# Patient Record
Sex: Female | Born: 1955 | Race: White | Hispanic: No | Marital: Married | Smoking: Never smoker
Health system: Southern US, Community
[De-identification: ages and names within clinical notes are randomized; demographics above are authoritative.]

## PROBLEM LIST (undated history)

## (undated) DIAGNOSIS — Z8619 Personal history of other infectious and parasitic diseases: Secondary | ICD-10-CM

## (undated) DIAGNOSIS — F32A Depression, unspecified: Secondary | ICD-10-CM

## (undated) DIAGNOSIS — I471 Supraventricular tachycardia, unspecified: Secondary | ICD-10-CM

## (undated) DIAGNOSIS — F419 Anxiety disorder, unspecified: Secondary | ICD-10-CM

## (undated) DIAGNOSIS — M1990 Unspecified osteoarthritis, unspecified site: Secondary | ICD-10-CM

## (undated) DIAGNOSIS — N200 Calculus of kidney: Secondary | ICD-10-CM

## (undated) DIAGNOSIS — M810 Age-related osteoporosis without current pathological fracture: Secondary | ICD-10-CM

## (undated) DIAGNOSIS — M797 Fibromyalgia: Secondary | ICD-10-CM

## (undated) DIAGNOSIS — Z87442 Personal history of urinary calculi: Secondary | ICD-10-CM

## (undated) DIAGNOSIS — I10 Essential (primary) hypertension: Secondary | ICD-10-CM

## (undated) HISTORY — PX: DILATION AND CURETTAGE, DIAGNOSTIC / THERAPEUTIC: SUR384

## (undated) HISTORY — PX: LITHOTRIPSY: SUR834

## (undated) HISTORY — DX: Unspecified osteoarthritis, unspecified site: M19.90

## (undated) HISTORY — PX: CARDIAC ELECTROPHYSIOLOGY STUDY AND ABLATION: SHX1294

## (undated) HISTORY — DX: Depression, unspecified: F32.A

## (undated) HISTORY — PX: ABDOMINAL HYSTERECTOMY: SUR658

## (undated) HISTORY — DX: Personal history of other infectious and parasitic diseases: Z86.19

## (undated) HISTORY — DX: Supraventricular tachycardia, unspecified: I47.10

## (undated) HISTORY — DX: Calculus of kidney: N20.0

## (undated) HISTORY — DX: Supraventricular tachycardia: I47.1

## (undated) HISTORY — DX: Age-related osteoporosis without current pathological fracture: M81.0

## (undated) HISTORY — PX: KNEE CARTILAGE SURGERY: SHX688

---

## 2019-07-26 ENCOUNTER — Other Ambulatory Visit: Payer: Self-pay

## 2019-07-26 ENCOUNTER — Encounter: Payer: Self-pay | Admitting: Internal Medicine

## 2019-07-26 ENCOUNTER — Ambulatory Visit (INDEPENDENT_AMBULATORY_CARE_PROVIDER_SITE_OTHER): Payer: Medicare Other | Admitting: Internal Medicine

## 2019-07-26 VITALS — BP 116/74 | HR 70 | Temp 97.2°F | Ht 67.0 in | Wt 185.6 lb

## 2019-07-26 DIAGNOSIS — Z23 Encounter for immunization: Secondary | ICD-10-CM | POA: Diagnosis not present

## 2019-07-26 DIAGNOSIS — I471 Supraventricular tachycardia: Secondary | ICD-10-CM | POA: Diagnosis not present

## 2019-07-26 DIAGNOSIS — E663 Overweight: Secondary | ICD-10-CM

## 2019-07-26 DIAGNOSIS — Z13820 Encounter for screening for osteoporosis: Secondary | ICD-10-CM

## 2019-07-26 DIAGNOSIS — Z780 Asymptomatic menopausal state: Secondary | ICD-10-CM | POA: Diagnosis not present

## 2019-07-26 DIAGNOSIS — Z8619 Personal history of other infectious and parasitic diseases: Secondary | ICD-10-CM | POA: Insufficient documentation

## 2019-07-26 DIAGNOSIS — Z1211 Encounter for screening for malignant neoplasm of colon: Secondary | ICD-10-CM

## 2019-07-26 HISTORY — DX: Overweight: E66.3

## 2019-07-26 NOTE — Addendum Note (Signed)
Addended by: Kern Reap B on: 07/26/2019 05:30 PM   Modules accepted: Orders

## 2019-07-26 NOTE — Progress Notes (Signed)
New Patient Office Visit     This visit occurred during the SARS-CoV-2 public health emergency.  Safety protocols were in place, including screening questions prior to the visit, additional usage of staff PPE, and extensive cleaning of exam room while observing appropriate contact time as indicated for disinfecting solutions.    CC/Reason for Visit: Establish care, discuss chronic conditions Previous PCP: In [place] Last Visit: Approximately 18 months ago  HPI: Jodi Mcgrath is a 64 y.o. female who is coming in today for the above mentioned reasons. Past Medical History is significant for: History of supraventricular tachycardia status post ablation in 2009 and maintained on metoprolol 25 mg twice daily and has been well controlled.  She also suffered from Lyme disease in 2006 and states she has multiple joint pains related to Lyme disease episodes.  She moved from [place] 2 years ago to be closer to her daughter who has 5 children.  She is a never smoker, does not drink alcohol, does not have any known drug allergies.  Her past surgical history significant for lithotripsy in 2006 and a hysterectomy in 2011.  Her family history significant for a brother with coronary artery disease, another brother with coronary artery disease and diabetes, her father had non-Hodgkin's lymphoma and coronary artery disease.  She has no acute complaints today.  She is requesting Pneumovax today.  She had a normal mammogram earlier this year, and is due for colon and cervical cancer screening, she is also due for a bone density test.   Past Medical/Surgical History: Past Medical History:  Diagnosis Date  . H/o Lyme disease   . SVT (supraventricular tachycardia) (HCC)     Past Surgical History:  Procedure Laterality Date  . LITHOTRIPSY      Social History:  reports that she has never smoked. She has never used smokeless tobacco. She reports that she does not drink alcohol or use  drugs.  Allergies: Allergies  Allergen Reactions  . Mucinex [Guaifenesin Er] Rash    Family History:  Family History  Problem Relation Age of Onset  . CAD Father   . Non-Hodgkin's lymphoma Father   . CAD Brother   . Diabetes Brother   . Diabetes Brother      Current Outpatient Medications:  .  metoprolol tartrate (LOPRESSOR) 25 MG tablet, Take 25 mg by mouth 2 (two) times daily., Disp: , Rfl:   Review of Systems:  Constitutional: Denies fever, chills, diaphoresis, appetite change and fatigue.  HEENT: Denies photophobia, eye pain, redness, hearing loss, ear pain, congestion, sore throat, rhinorrhea, sneezing, mouth sores, trouble swallowing, neck pain, neck stiffness and tinnitus.   Respiratory: Denies SOB, DOE, cough, chest tightness,  and wheezing.   Cardiovascular: Denies chest pain, palpitations and leg swelling.  Gastrointestinal: Denies nausea, vomiting, abdominal pain, diarrhea, constipation, blood in stool and abdominal distention.  Genitourinary: Denies dysuria, urgency, frequency, hematuria, flank pain and difficulty urinating.  Endocrine: Denies: hot or cold intolerance, sweats, changes in hair or nails, polyuria, polydipsia. Musculoskeletal: Denies myalgias, back pain, joint swelling, arthralgias and gait problem.  Skin: Denies pallor, rash and wound.  Neurological: Denies dizziness, seizures, syncope, weakness, light-headedness, numbness and headaches.  Hematological: Denies adenopathy. Easy bruising, personal or family bleeding history  Psychiatric/Behavioral: Denies suicidal ideation, mood changes, confusion, nervousness, sleep disturbance and agitation    Physical Exam: Vitals:   07/26/19 0950  BP: 116/74  Pulse: 70  Temp: (!) 97.2 F (36.2 C)  TempSrc: Temporal  SpO2: 96%  Weight:  185 lb 9.6 oz (84.2 kg)  Height: 5\' 7"  (1.702 m)   Body mass index is 29.07 kg/m.  Constitutional: NAD, calm, comfortable Eyes: PERRL, lids and conjunctivae normal,  wears corrective lenses ENMT: Mucous membranes are moist. Respiratory: clear to auscultation bilaterally, no wheezing, no crackles. Normal respiratory effort. No accessory muscle use.  Cardiovascular: Regular rate and rhythm, no murmurs / rubs / gallops. No extremity edema.   Neurologic: Grossly intact and nonfocal Psychiatric: Normal judgment and insight. Alert and oriented x 3. Normal mood.    Impression and Plan:  Screening for osteoporosis -DEXA scan requested  SVT (supraventricular tachycardia) (HCC) -Well-controlled on beta-blocker.  Overweight (BMI 25.0-29.9) -Discussed healthy lifestyle, including increased physical activity and better food choices to promote weight loss.  Screening for malignant neoplasm of colon  - Plan: Ambulatory referral to Gastroenterology  H/o Lyme disease -Noted, unclear if multiple joint pain issues are related to this.  She will schedule follow-up for physical.  She will obtain shingles vaccination at her pharmacy.     Patient Instructions  -Nice seeing you today!!  -Pneumonia vaccine today.  -Shingles at your local pharmacy.  -Schedule 6 month follow up for your physical. Please come in fasting that day.     05-23-2001, MD [facility] at [facility]

## 2019-07-26 NOTE — Patient Instructions (Signed)
-  Nice seeing you today!!  -Pneumonia vaccine today.  -Shingles at your local pharmacy.  -Schedule 6 month follow up for your physical. Please come in fasting that day.

## 2019-07-28 ENCOUNTER — Telehealth: Payer: Self-pay | Admitting: Internal Medicine

## 2019-07-28 NOTE — Telephone Encounter (Signed)
Message Routed to PCP CMA 

## 2019-07-28 NOTE — Telephone Encounter (Signed)
Copied from CRM [id number]. Topic: General - Other >> Jul 28, 2019  9:41 AM Tamela Oddi wrote: Reason for CRM: Patient called to ask about the bone density test that the doctor stated she needed.  CB# [phone]

## 2019-07-29 NOTE — Telephone Encounter (Signed)
Patient would like to schedule a DEXA.  Scheduled.

## 2019-08-03 ENCOUNTER — Other Ambulatory Visit: Payer: Medicare Other

## 2019-08-09 ENCOUNTER — Ambulatory Visit (INDEPENDENT_AMBULATORY_CARE_PROVIDER_SITE_OTHER)
Admission: RE | Admit: 2019-08-09 | Discharge: 2019-08-09 | Disposition: A | Discharge status: Home / Self Care | Payer: Medicare Other | Source: Ambulatory Visit | Attending: Internal Medicine | Admitting: Internal Medicine

## 2019-08-09 ENCOUNTER — Other Ambulatory Visit: Payer: Self-pay

## 2019-08-09 DIAGNOSIS — Z780 Asymptomatic menopausal state: Secondary | ICD-10-CM | POA: Diagnosis not present

## 2019-08-10 ENCOUNTER — Other Ambulatory Visit: Payer: Self-pay

## 2019-08-10 ENCOUNTER — Ambulatory Visit (INDEPENDENT_AMBULATORY_CARE_PROVIDER_SITE_OTHER): Payer: Medicare Other | Admitting: Sports Medicine

## 2019-08-10 ENCOUNTER — Encounter: Payer: Self-pay | Admitting: Sports Medicine

## 2019-08-10 VITALS — BP 128/82 | Ht 67.0 in | Wt 180.0 lb

## 2019-08-10 DIAGNOSIS — M25561 Pain in right knee: Secondary | ICD-10-CM

## 2019-08-10 MED ORDER — METHYLPREDNISOLONE ACETATE 40 MG/ML IJ SUSP
40.0000 mg | Freq: Once | INTRAMUSCULAR | Status: AC
  Administered 2019-08-10: 40 mg via INTRA_ARTICULAR

## 2019-08-10 NOTE — Addendum Note (Signed)
Addended by: Rutha Bouchard E on: 08/10/2019 02:36 PM   Modules accepted: Orders

## 2019-08-10 NOTE — Patient Instructions (Signed)
Your knee pain is caused by arthritis of the right knee.  The ultrasound we did today showed mild arthritic changes in your knee. -To better evaluate the extent of the arthritis I have ordered x-rays.  You can get these done at your convenience and I will call you with results 1-2 business days after the x-rays get done -Steroid injection given to you at today's visit will take several days to start working.  You may ice your knee later tonight if it is sore -I have given you quad strengthening exercises.  Work on the program shown to you at today's visit to help increase your quad strength  I will see you back on an as-needed basis.  In 1 month if you are still having pain please come back to see me and we can discuss further steps in treating her pain.  If your pain completely resolves then no further follow-up is needed

## 2019-08-10 NOTE — Progress Notes (Addendum)
PCP: Isaac Bliss, Rayford Halsted, MD  Subjective:   HPI: Patient is a 64 y.o. female here for evaluation of right knee pain.  Patient notes her pain is been present for the last several weeks.  She denies any specific injury or trauma.  Pain is located above her knee and her quads as well as along the medial aspect of her knee.  She denies any mechanical symptoms such as locking or catching.  She denies any bruising or swelling.  Patient notes she is an avid walker and this has been significantly affecting her ability to be active.  She does note a remote history of right knee surgery back in the 70s.  She is unsure exactly what was done to it but believes it may have been an ACL repair or a meniscus repair.  Patient has not done anything for her knee pain yet.  She has not had any imaging of her knee.   Review of Systems: See HPI above.  Past Medical History:  Diagnosis Date  . H/o Lyme disease   . SVT (supraventricular tachycardia) (HCC)     Current Outpatient Medications on File Prior to Visit  Medication Sig Dispense Refill  . metoprolol tartrate (LOPRESSOR) 25 MG tablet Take 25 mg by mouth 2 (two) times daily.     No current facility-administered medications on file prior to visit.    Past Surgical History:  Procedure Laterality Date  . LITHOTRIPSY      Allergies  Allergen Reactions  . Mucinex [Guaifenesin Er] Rash    Social History   Socioeconomic History  . Marital status: Married    Spouse name: Not on file  . Number of children: Not on file  . Years of education: Not on file  . Highest education level: Not on file  Occupational History  . Not on file  Tobacco Use  . Smoking status: Never Smoker  . Smokeless tobacco: Never Used  Substance and Sexual Activity  . Alcohol use: Never  . Drug use: Never  . Sexual activity: Not on file  Other Topics Concern  . Not on file  Social History Narrative  . Not on file   Social Determinants of Health   Financial  Resource Strain:   . Difficulty of Paying Living Expenses: Not on file  Food Insecurity:   . Worried About Charity fundraiser in the Last Year: Not on file  . Ran Out of Food in the Last Year: Not on file  Transportation Needs:   . Lack of Transportation (Medical): Not on file  . Lack of Transportation (Non-Medical): Not on file  Physical Activity:   . Days of Exercise per Week: Not on file  . Minutes of Exercise per Session: Not on file  Stress:   . Feeling of Stress : Not on file  Social Connections:   . Frequency of Communication with Friends and Family: Not on file  . Frequency of Social Gatherings with Friends and Family: Not on file  . Attends Religious Services: Not on file  . Active Member of Clubs or Organizations: Not on file  . Attends Archivist Meetings: Not on file  . Marital Status: Not on file  Intimate Partner Violence:   . Fear of Current or Ex-Partner: Not on file  . Emotionally Abused: Not on file  . Physically Abused: Not on file  . Sexually Abused: Not on file    Family History  Problem Relation Age of Onset  . CAD  Father   . Non-Hodgkin's lymphoma Father   . CAD Brother   . Diabetes Brother   . Diabetes Brother         Objective:  Physical Exam: BP 128/82   Ht 5\' 7"  (1.702 m)   Wt 180 lb (81.6 kg)   BMI 28.19 kg/m  Gen: NAD, comfortable in exam room Lungs: Breathing comfortably on room air Knee Exam Right -Inspection: Longitudinal scar along the medial aspect of her knee from her prior knee surgery.  No other abnormality was seen -Palpation: Medial joint line tenderness -ROM: Extension: -0 degrees; Flexion: 130 degrees -Strength: Extension: 5/5; Flexion: 5/5 -Special Tests: Varus Stress: Negative; Valgus Stress: Negative; Lachman: Negative; Posterior drawer: Negative; McMurray: Negative; Thessaly: Negative -Limb neurovascularly intact, no instability noted  Contralateral Knee -Inspection: no deformity, no  discoloration -Palpation: medial joint line: Non-tender; lateral joint line: non-tender; quad tendon: non-tender; patella: non-tender; patellar tendon: non-tendon -ROM: Extension: 0 degrees; Flexion: 140 degrees -Strength: Extension: 5/5; Flexion: 5/5 -Limb neurovascularly intact, no instability noted  Limited diagnostic ultrasound of the right knee Findings: -Normal appearance of the quadricep tendon.  No tears were noted -Small amount of spurs noted at the patella at the quadriceps attachment -Normal appearance of the patellar tendon -Small joint effusion noted within the suprapatellar pouch -Fraying noted at the lateral meniscus.  There is also moderate amount of bone spurs along the lateral joint line. -Fraying at the medial meniscus.  Moderate amount of bone spurs along the medial joint line.  Is also fluid/edema noted surrounding the medial meniscus Impression: -Moderate amount of degenerative changes seen as well as possible fraying of the medial lateral meniscus   Assessment & Plan:  Patient is a 64 y.o. female here for evaluation of right knee pain  1.  Right knee pain -Ultrasound showing arthritic changes -X-rays were ordered to better evaluate the extent of the arthritis in her right knee -Consent was obtained for corticosteroid injection of right knee.  Timeout was performed prior to the procedure.  40 mg of Depo-Medrol 3 cc of lidocaine were injected using sterile technique.  Patient tolerated the injection well there are no complications -Patient was given a quad strengthening exercise program -Patient will follow up on an as-needed basis.  We will call her with the results of the x-rays  Addendum:  I was the preceptor for this visit and available for immediate consultation.  76 MD Norton Blizzard

## 2019-08-15 ENCOUNTER — Telehealth: Payer: Self-pay | Admitting: Internal Medicine

## 2019-08-15 NOTE — Telephone Encounter (Signed)
Patient needs to ask a question about Bone Density results.  She states the results didn't show up good.

## 2019-08-17 NOTE — Telephone Encounter (Signed)
Spoke with patient and a virtual visit was scheduled.   

## 2019-08-19 ENCOUNTER — Other Ambulatory Visit: Payer: Self-pay

## 2019-08-19 ENCOUNTER — Telehealth (INDEPENDENT_AMBULATORY_CARE_PROVIDER_SITE_OTHER): Payer: Medicare Other | Admitting: Internal Medicine

## 2019-08-19 DIAGNOSIS — M816 Localized osteoporosis [Lequesne]: Secondary | ICD-10-CM | POA: Diagnosis not present

## 2019-08-19 MED ORDER — ALENDRONATE SODIUM 70 MG PO TABS: 70.0000 mg | ORAL_TABLET | ORAL | 3 refills | Status: DC

## 2019-08-19 NOTE — Progress Notes (Signed)
Virtual Visit via Video Note  I connected with Jodi Mcgrath on 08/19/19 at  1:30 PM EST by a video enabled telemedicine application and verified that I am speaking with the correct person using two identifiers.  Location patient: home Location provider: work office Persons participating in the virtual visit: patient, provider  I discussed the limitations of evaluation and management by telemedicine and the availability of in person appointments. The patient expressed understanding and agreed to proceed.   HPI: This visit has been scheduled to follow-up on her DEXA scan.  Her DEXA was done on January 19 and showed a lumbar spine T score of -2.5, right femoral neck was -1.9 and left femoral neck was -1.4.  She feels well and has no issues.  She is wondering whether she can continue her active lifestyle despite these results.   ROS: Constitutional: Denies fever, chills, diaphoresis, appetite change and fatigue.  HEENT: Denies photophobia, eye pain, redness, hearing loss, ear pain, congestion, sore throat, rhinorrhea, sneezing, mouth sores, trouble swallowing, neck pain, neck stiffness and tinnitus.   Respiratory: Denies SOB, DOE, cough, chest tightness,  and wheezing.   Cardiovascular: Denies chest pain, palpitations and leg swelling.  Gastrointestinal: Denies nausea, vomiting, abdominal pain, diarrhea, constipation, blood in stool and abdominal distention.  Genitourinary: Denies dysuria, urgency, frequency, hematuria, flank pain and difficulty urinating.  Endocrine: Denies: hot or cold intolerance, sweats, changes in hair or nails, polyuria, polydipsia. Musculoskeletal: Denies myalgias, back pain, joint swelling, arthralgias and gait problem.  Skin: Denies pallor, rash and wound.  Neurological: Denies dizziness, seizures, syncope, weakness, light-headedness, numbness and headaches.  Hematological: Denies adenopathy. Easy bruising, personal or family bleeding history    Psychiatric/Behavioral: Denies suicidal ideation, mood changes, confusion, nervousness, sleep disturbance and agitation   Past Medical History:  Diagnosis Date  . H/o Lyme disease   . SVT (supraventricular tachycardia) (HCC)     Past Surgical History:  Procedure Laterality Date  . LITHOTRIPSY      Family History  Problem Relation Age of Onset  . CAD Father   . Non-Hodgkin's lymphoma Father   . CAD Brother   . Diabetes Brother   . Diabetes Brother     SOCIAL HX:   reports that she has never smoked. She has never used smokeless tobacco. She reports that she does not drink alcohol or use drugs.   Current Outpatient Medications:  .  alendronate (FOSAMAX) 70 MG tablet, Take 1 tablet (70 mg total) by mouth every 7 (seven) days. Take with a full glass of water on an empty stomach., Disp: 4 tablet, Rfl: 3 .  metoprolol tartrate (LOPRESSOR) 25 MG tablet, Take 25 mg by mouth 2 (two) times daily., Disp: , Rfl:   EXAM:   VITALS per patient if applicable: None reported  GENERAL: alert, oriented, appears well and in no acute distress  HEENT: atraumatic, conjunttiva clear, no obvious abnormalities on inspection of external nose and ears, wears corrective lenses  NECK: normal movements of the head and neck  LUNGS: on inspection no signs of respiratory distress, breathing rate appears normal, no obvious gross increased work of breathing, gasping or wheezing  CV: no obvious cyanosis  MS: moves all visible extremities without noticeable abnormality  PSYCH/NEURO: pleasant and cooperative, no obvious depression or anxiety, speech and thought processing grossly intact  ASSESSMENT AND PLAN:   Localized osteoporosis without current pathological fracture  -After discussing pros and cons, we have decided to start her on Fosamax 70 mg weekly. -She  will secure dental care. -Okay to continue active lifestyle as she is. -She will also maximize calcium and vitamin D supplementation.     I discussed the assessment and treatment plan with the patient. The patient was provided an opportunity to ask questions and all were answered. The patient agreed with the plan and demonstrated an understanding of the instructions.   The patient was advised to call back or seek an in-person evaluation if the symptoms worsen or if the condition fails to improve as anticipated.    Chaya Jan, MD  [facility] at [facility]

## 2019-08-23 ENCOUNTER — Ambulatory Visit: Payer: Medicare Other | Admitting: Sports Medicine

## 2019-08-25 ENCOUNTER — Ambulatory Visit
Admission: RE | Admit: 2019-08-25 | Discharge: 2019-08-25 | Disposition: A | Discharge status: Home / Self Care | Payer: Medicare Other | Source: Ambulatory Visit | Attending: Family Medicine | Admitting: Family Medicine

## 2019-08-25 DIAGNOSIS — M25561 Pain in right knee: Secondary | ICD-10-CM

## 2019-08-26 ENCOUNTER — Encounter: Payer: Self-pay | Admitting: Sports Medicine

## 2019-08-26 ENCOUNTER — Telehealth (INDEPENDENT_AMBULATORY_CARE_PROVIDER_SITE_OTHER): Payer: Medicare Other | Admitting: Sports Medicine

## 2019-08-26 VITALS — Ht 67.0 in | Wt 180.0 lb

## 2019-08-26 DIAGNOSIS — M1711 Unilateral primary osteoarthritis, right knee: Secondary | ICD-10-CM

## 2019-08-26 MED ORDER — MELOXICAM 15 MG PO TABS: 15.0000 mg | ORAL_TABLET | Freq: Every day | ORAL | 0 refills | Status: DC

## 2019-08-26 NOTE — Progress Notes (Addendum)
PCP: Philip Aspen, Limmie Patricia, MD  Subjective:   HPI: Patient is a 64 y.o. female here for virtual follow-up of her right knee pain.  Patient was last seen on January 20.  She had x-rays done since last visit confirming mild to moderate tricompartmental osteoarthritis of the right knee.  Patient had a corticosteroid injection done at her last visit.  Patient notes ongoing knee pain.  She is gotten slightly worse swelling since she was last seen.  Patient is still trying to walk about 3 miles a day but notes this is become difficult for her.  She not get much benefit from her corticosteroid injection.   Review of Systems: See HPI above.  Past Medical History:  Diagnosis Date  . H/o Lyme disease   . SVT (supraventricular tachycardia) (HCC)     Current Outpatient Medications on File Prior to Visit  Medication Sig Dispense Refill  . metoprolol tartrate (LOPRESSOR) 25 MG tablet Take 25 mg by mouth 2 (two) times daily.    Marland Kitchen alendronate (FOSAMAX) 70 MG tablet Take 1 tablet (70 mg total) by mouth every 7 (seven) days. Take with a full glass of water on an empty stomach. 4 tablet 3   No current facility-administered medications on file prior to visit.    Past Surgical History:  Procedure Laterality Date  . LITHOTRIPSY      Allergies  Allergen Reactions  . Mucinex [Guaifenesin Er] Rash    Social History   Socioeconomic History  . Marital status: Married    Spouse name: Not on file  . Number of children: Not on file  . Years of education: Not on file  . Highest education level: Not on file  Occupational History  . Not on file  Tobacco Use  . Smoking status: Never Smoker  . Smokeless tobacco: Never Used  Substance and Sexual Activity  . Alcohol use: Never  . Drug use: Never  . Sexual activity: Not on file  Other Topics Concern  . Not on file  Social History Narrative  . Not on file   Social Determinants of Health   Financial Resource Strain:   . Difficulty of Paying  Living Expenses: Not on file  Food Insecurity:   . Worried About Programme researcher, broadcasting/film/video in the Last Year: Not on file  . Ran Out of Food in the Last Year: Not on file  Transportation Needs:   . Lack of Transportation (Medical): Not on file  . Lack of Transportation (Non-Medical): Not on file  Physical Activity:   . Days of Exercise per Week: Not on file  . Minutes of Exercise per Session: Not on file  Stress:   . Feeling of Stress : Not on file  Social Connections:   . Frequency of Communication with Friends and Family: Not on file  . Frequency of Social Gatherings with Friends and Family: Not on file  . Attends Religious Services: Not on file  . Active Member of Clubs or Organizations: Not on file  . Attends Banker Meetings: Not on file  . Marital Status: Not on file  Intimate Partner Violence:   . Fear of Current or Ex-Partner: Not on file  . Emotionally Abused: Not on file  . Physically Abused: Not on file  . Sexually Abused: Not on file    Family History  Problem Relation Age of Onset  . CAD Father   . Non-Hodgkin's lymphoma Father   . CAD Brother   . Diabetes Brother   .  Diabetes Brother         Objective:  Physical Exam: Ht 5\' 7"  (1.702 m)   Wt 180 lb (81.6 kg)   BMI 28.19 kg/m  Gen: NAD, comfortable in exam room Lungs: Breathing comfortably on room air   Assessment & Plan:  Patient is a 64 y.o. female here for virtual follow-up of right knee pain next  1.  Right knee osteoarthritis -Patient did not get significant benefit from her corticosteroid injection done on August 10, 2019 -Prescription was sent for meloxicam.  Patient is advised to take this once a day and not take it with other NSAIDs.  She may take Tylenol with this -Patient has a brace at home that she will start wearing with activity -Patient will follow up with me in the office on Wednesday.  At that visit we will discuss possibly getting the MRI to evaluate for concurrent meniscus  tear as her arthritis is mild versus discussion of possible viscosupplementation  I was the preceptor for this visit and available for immediate consultation Shellia Cleverly, DO

## 2019-08-29 ENCOUNTER — Telehealth: Payer: Self-pay

## 2019-08-29 NOTE — Telephone Encounter (Signed)
I told the pt we can cancel her f/u appt and if her pain worsens she can call us to come in. The plan going forward would be possible MRI vs viscosupplementation injections as Dr. Lyn Hollingshead discussed with her at her last visit. Pt understands.

## 2019-11-01 ENCOUNTER — Encounter: Payer: Self-pay | Admitting: Internal Medicine

## 2019-12-02 ENCOUNTER — Telehealth (INDEPENDENT_AMBULATORY_CARE_PROVIDER_SITE_OTHER): Payer: Medicare Other | Admitting: Internal Medicine

## 2019-12-02 DIAGNOSIS — R42 Dizziness and giddiness: Secondary | ICD-10-CM | POA: Diagnosis not present

## 2019-12-02 DIAGNOSIS — H9312 Tinnitus, left ear: Secondary | ICD-10-CM | POA: Diagnosis not present

## 2019-12-02 MED ORDER — MECLIZINE HCL 25 MG PO TABS: 25.0000 mg | ORAL_TABLET | Freq: Three times a day (TID) | ORAL | 0 refills | Status: DC | PRN

## 2019-12-02 NOTE — Progress Notes (Signed)
Virtual Visit via Video Note  I connected with Jodi Mcgrath on 12/02/19 at  3:30 PM EDT by a video enabled telemedicine application and verified that I am speaking with the correct person using two identifiers.  Location patient: home Location provider: work office Persons participating in the virtual visit: patient, provider  I discussed the limitations of evaluation and management by telemedicine and the availability of in person appointments. The patient expressed understanding and agreed to proceed.   HPI: She has had issues with left ear vertigo and tinnitus for years. Has had MRIs in the past and has seen ENT without issues. Tinnitus and vertigo has gotten worse in the past week. Hearing has decreased as well.   ROS: Constitutional: Denies fever, chills, diaphoresis, appetite change and fatigue.  HEENT: Denies photophobia, eye pain, redness, hearing loss, ear pain, congestion, sore throat, rhinorrhea, sneezing, mouth sores, trouble swallowing, neck pain, neck stiffness and tinnitus.   Respiratory: Denies SOB, DOE, cough, chest tightness,  and wheezing.   Cardiovascular: Denies chest pain, palpitations and leg swelling.  Gastrointestinal: Denies nausea, vomiting, abdominal pain, diarrhea, constipation, blood in stool and abdominal distention.  Genitourinary: Denies dysuria, urgency, frequency, hematuria, flank pain and difficulty urinating.  Endocrine: Denies: hot or cold intolerance, sweats, changes in hair or nails, polyuria, polydipsia. Musculoskeletal: Denies myalgias, back pain, joint swelling, arthralgias and gait problem.  Skin: Denies pallor, rash and wound.  Neurological: Denies dizziness, seizures, syncope, weakness, light-headedness, numbness and headaches.  Hematological: Denies adenopathy. Easy bruising, personal or family bleeding history  Psychiatric/Behavioral: Denies suicidal ideation, mood changes, confusion, nervousness, sleep disturbance and  agitation   Past Medical History:  Diagnosis Date  . H/o Lyme disease   . SVT (supraventricular tachycardia) (HCC)     Past Surgical History:  Procedure Laterality Date  . LITHOTRIPSY      Family History  Problem Relation Age of Onset  . CAD Father   . Non-Hodgkin's lymphoma Father   . CAD Brother   . Diabetes Brother   . Diabetes Brother     SOCIAL HX:   reports that she has never smoked. She has never used smokeless tobacco. She reports that she does not drink alcohol or use drugs.   Current Outpatient Medications:  .  alendronate (FOSAMAX) 70 MG tablet, Take 1 tablet (70 mg total) by mouth every 7 (seven) days. Take with a full glass of water on an empty stomach., Disp: 4 tablet, Rfl: 3 .  meclizine (ANTIVERT) 25 MG tablet, Take 1 tablet (25 mg total) by mouth 3 (three) times daily as needed for dizziness., Disp: 30 tablet, Rfl: 0 .  meloxicam (MOBIC) 15 MG tablet, Take 1 tablet (15 mg total) by mouth daily., Disp: 30 tablet, Rfl: 0 .  metoprolol tartrate (LOPRESSOR) 25 MG tablet, Take 25 mg by mouth 2 (two) times daily., Disp: , Rfl:   EXAM:   VITALS per patient if applicable:   GENERAL: alert, oriented, appears well and in no acute distress  HEENT: atraumatic, conjunttiva clear, no obvious abnormalities on inspection of external nose and ears, wears corrective lenses  NECK: normal movements of the head and neck  LUNGS: on inspection no signs of respiratory distress, breathing rate appears normal, no obvious gross increased work of breathing, gasping or wheezing  CV: no obvious cyanosis  MS: moves all visible extremities without noticeable abnormality  PSYCH/NEURO: pleasant and cooperative, no obvious depression or anxiety, speech and thought processing grossly intact  ASSESSMENT AND PLAN:  Tinnitus of left ear   Vertigo  -Will give some meclizine, advised B complex vitamins. -Have offered referral back to ENT but she would like to hold on that for  now. -RTC if no improvement in 10-14 days.     I discussed the assessment and treatment plan with the patient. The patient was provided an opportunity to ask questions and all were answered. The patient agreed with the plan and demonstrated an understanding of the instructions.   The patient was advised to call back or seek an in-person evaluation if the symptoms worsen or if the condition fails to improve as anticipated.    Chaya Jan, MD  [facility]

## 2019-12-12 ENCOUNTER — Other Ambulatory Visit: Payer: Self-pay

## 2019-12-13 ENCOUNTER — Ambulatory Visit: Payer: Medicare Other | Admitting: Internal Medicine

## 2020-01-26 ENCOUNTER — Telehealth: Payer: Self-pay | Admitting: Internal Medicine

## 2020-01-26 MED ORDER — METOPROLOL TARTRATE 25 MG PO TABS: 25.0000 mg | ORAL_TABLET | Freq: Two times a day (BID) | ORAL | 1 refills | Status: DC

## 2020-01-26 NOTE — Telephone Encounter (Signed)
Refill sent.

## 2020-01-26 NOTE — Telephone Encounter (Signed)
Pt is calling in stating that she is almost out (5 pills on hand) of metoprolol tartrate (LOPRESSOR) 25 MG Pharm:  [facility] in [facility]

## 2020-03-23 ENCOUNTER — Telehealth: Payer: Self-pay | Admitting: Internal Medicine

## 2020-03-23 NOTE — Telephone Encounter (Signed)
Nurse Triage advise that the patient go to the ED

## 2020-03-23 NOTE — Telephone Encounter (Signed)
Kimberly,PT is calling in stating that the pt is at the center for PT and she has High BP and would like to see what she should do.  Pt told the physical therapist that she has always been bothered with high blood pressure but she is not thinking it had been like this before. They were transferred to the Triage Nurse.

## 2020-03-23 NOTE — Telephone Encounter (Signed)
Patient called back and was complaining of vertigo and a migraine.  She refused to go to the ED.  I advised the patient to go to UC.

## 2020-05-29 IMAGING — DX DG KNEE AP/LAT W/ SUNRISE*R*
3 series · 3 of 3 positions shown · non-contrast
Comparison: None.

CLINICAL DATA: Chronic right knee pain

EXAM:
RIGHT KNEE 3 VIEWS

[dg knee ap/lat w/ sunrise right (1 of 3)]
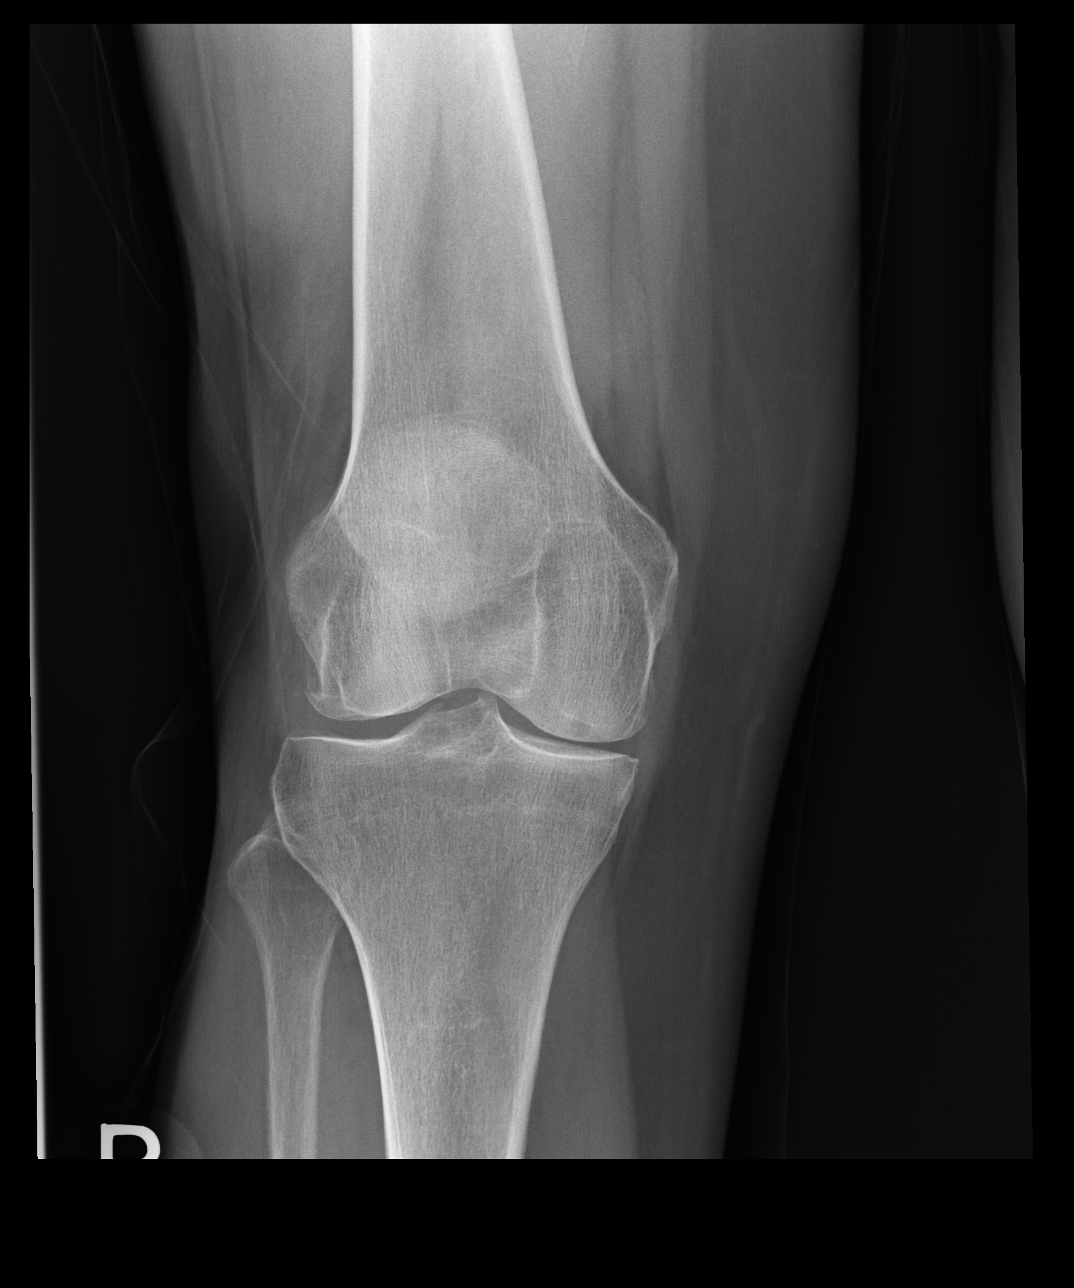

[dg knee ap/lat w/ sunrise right (2 of 3)]
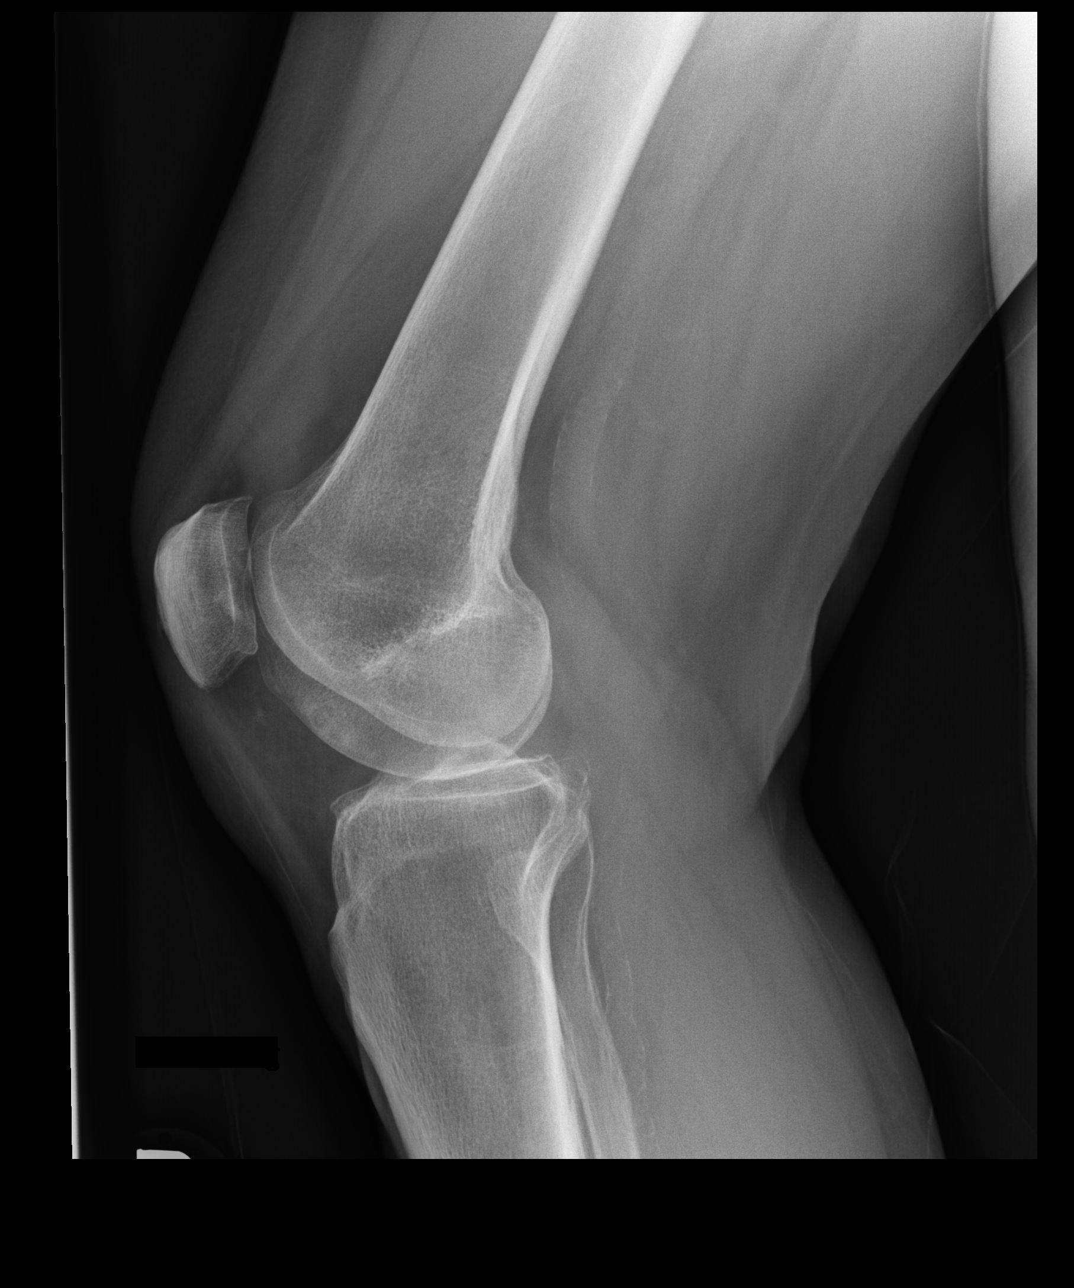

[dg knee ap/lat w/ sunrise right (3 of 3)]
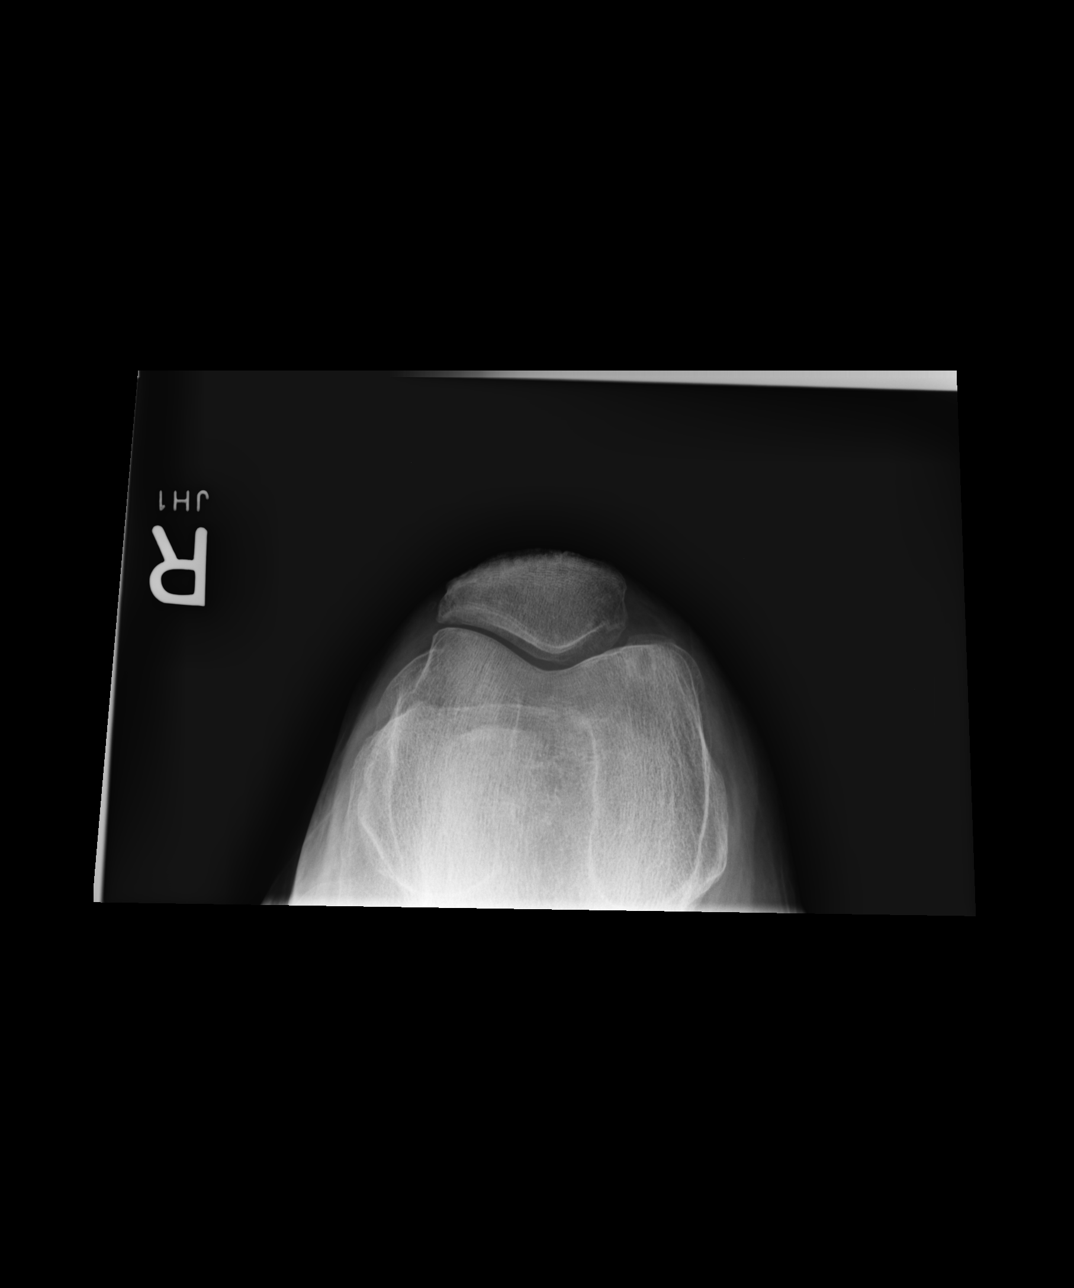

[3 of 3 positions shown; findings below may reference images not displayed]

FINDINGS: There is moderate medial compartmental joint space narrowing. Mild
narrowing is seen in the lateral compartment. Small spurs are seen
along the upper and lower poles of the patella. No fracture,
subluxation, or dislocation. Small joint effusion is likely
reactive.
IMPRESSION: 1. Mild to moderate 3 compartmental osteoarthritis greatest in the
medial compartment.
2. Small reactive right knee effusion.

## 2020-06-27 ENCOUNTER — Ambulatory Visit (INDEPENDENT_AMBULATORY_CARE_PROVIDER_SITE_OTHER): Payer: Medicare Other | Admitting: Family Medicine

## 2020-06-27 ENCOUNTER — Encounter: Payer: Self-pay | Admitting: Family Medicine

## 2020-06-27 ENCOUNTER — Other Ambulatory Visit: Payer: Self-pay

## 2020-06-27 DIAGNOSIS — M1711 Unilateral primary osteoarthritis, right knee: Secondary | ICD-10-CM | POA: Insufficient documentation

## 2020-06-27 HISTORY — DX: Unilateral primary osteoarthritis, right knee: M17.11

## 2020-06-27 MED ORDER — METHYLPREDNISOLONE ACETATE 80 MG/ML IJ SUSP
80.0000 mg | Freq: Once | INTRAMUSCULAR | Status: AC
  Administered 2020-06-27: 80 mg via INTRA_ARTICULAR

## 2020-06-27 NOTE — Progress Notes (Addendum)
PCP: Philip Aspen, Limmie Patricia, MD  Subjective:   HPI: Patient is a 64 y.o. female here for right knee pain.  Patient reports that this episode of her right knee pain has been going on for 3 weeks.  She has tried ibuprofen as well as Voltaren gel with little relief.  Pain is mainly with ambulation.  Tenderness to palpation along the anterior medial aspect of the knee.  Reports that it occasionally becomes erythematous  X-rays performed in January of this year showed mild to moderate tricompartmental osteoarthritis of the right knee.  Patient reports that she has not had a knee injection in "40 years".  She did have an injury to her right knee when she was 16 and she had surgery but is unsure of the surgery.  Past Medical History:  Diagnosis Date  . H/o Lyme disease   . SVT (supraventricular tachycardia) (HCC)     Family History  Problem Relation Age of Onset  . CAD Father   . Non-Hodgkin's lymphoma Father   . CAD Brother   . Diabetes Brother   . Diabetes Brother     BP (!) 150/72   Ht 5\' 7"  (1.702 m)   Wt 176 lb (79.8 kg)   BMI 27.57 kg/m   [facility] 06/27/2020  Frequency of aerobic exercise (# of days/week) 0  Average time in minutes 0  Frequency of strengthening activities (# of days/week) 0    No flowsheet data found.  Review of Systems: See HPI above.     Objective:  Physical Exam:  Gen: NAD, comfortable in exam room  Right knee exam No gross deformity, ecchymoses.  Mild amount of joint effusion noted Mild tenderness to palpation along medial and lateral joint lines.  Patient also has pain with valgus pressure. Mild decreased range of motion in extension.  Normal range of motion with flexion Negative ant/post drawers NV intact distally.  INJECTION: Informed written consent was obtained, timeout was performed. Area prepped in usual fashion with overlying skin cleaned using iodine and isopropyl alcohol. Local anesthesia achieved  using Ethyl Chloride spray (Cooling spray). 2 cc of methylprednisolone 40 mg/ml plus  4  cc of 1% lidocaine without epinephrine was injected into the right knee joint using a(n) anterior lateral approach. The patient tolerated the procedure well. There were no complications. Post procedure instructions were given.    Assessment & Plan:  Right knee pain Patient with 3-week knee pain.  Known osteoarthritis of right knee.  Findings consistent with osteoarthritic pain.  Mild joint effusion noted.  Patient elected for right knee injection today and this procedure was tolerated well.  We will follow-up as needed  14/02/2020, MD  PGY-2, [facility]  06/27/2109:55 AM   Resident Attestation   I saw and evaluated the patient, performing the key elements of the service.I personally performed or re-performed the history, physical exam, and medical decision making activities of this service and have verified that the service and findings are accurately documented in the resident's note. I developed the management plan that is described in the resident's note, and I agree with the content, with my edits above.   14/02/2109, DO [facility], PGY-4  Addendum:  Patient seen in the office by fellow and resident.  Their history, exam, plan of care were precepted with me.  Jules Schick MD [facility]

## 2020-06-27 NOTE — Patient Instructions (Signed)
It was great to meet you today! Thank you for letting me participate in your care!  Today, we discussed your right knee arthritis pain which is due to an acute flare. The steroid injection should give you great relief for at least 3 months.   Please do the home exercises 4-5 times per week and use a stationary bike along with water exercises/swimming to help strengthen the muscles around the knee to lessen pressure on the knee joint.  Please come back and see me if you do not get improvement.  Be well, Jules Schick, DO PGY-4, Sports Medicine Fellow [facility]

## 2020-08-01 ENCOUNTER — Other Ambulatory Visit: Payer: Self-pay

## 2020-08-01 ENCOUNTER — Ambulatory Visit (INDEPENDENT_AMBULATORY_CARE_PROVIDER_SITE_OTHER): Payer: Medicare Other | Admitting: Family Medicine

## 2020-08-01 VITALS — BP 126/70 | Ht 67.0 in | Wt 173.0 lb

## 2020-08-01 DIAGNOSIS — M1711 Unilateral primary osteoarthritis, right knee: Secondary | ICD-10-CM

## 2020-08-01 NOTE — Progress Notes (Addendum)
    SUBJECTIVE:   CHIEF COMPLAINT / HPI:   Jodi Mcgrath is a 65 y.o. female here for follow up of right knee pain.   Knee pain Pt reports resolution of posterior right knee pain but anteromedial knee pain persists. She takes Tylenol arthritis once a day with some relief. She occasionally takes Advil which helps more than Tylenol. She has been using her stationary bike which relieves her stiffness. She reports minimal relief with previous steroid injection.    States she use to be very active but is limited in activity due to her knee pain. Wants to be able to garden and interact with her five grandkids like before.   Unfortunately since her last visit the corticosteroid injection provided very minimal relief for only a few days.   PERTINENT  PMH / PSH: reviewed and updated as appropriate   OBJECTIVE:   Blood pressure 126/70, height 5\' 7"  (1.702 m), weight 173 lb (78.5 kg). Body mass index is 27.1 kg/m.   GEN: well appearing female in no acute distress  CVS: well perfused, distal pulses intact  RESP: speaking in full sentences without pause, no respiratory distress  MSK right knee:  -Inspection: no deformity, no discoloration, inferolateral effusion, positive for mild swelling -Palpation: no joint line tenderness -ROM: Extension:  Active ROM limited due to pain, normal passive ROM, Flexion: normal active and passive ROM   -strength 5/5  -Special Tests: Varus Stress: Negative; Valgus Stress: Negative; Posterior drawer: Negative; McMurray: Negative -Limb neurovascularly intact, no instability noted    ASSESSMENT/PLAN:   Patient is a 65 year old female with history of osteoarthritis who presented for ongoing right knee pain.  Osteoarthritic knee pain Patient without relief from previous knee injection.  Discussed several treatment options (knee sleeve, gel shots, surgery, NSAIDs and Tylenol).  Patient does not want to have surgery at this time and would like to continue  conservative therapy.  Recommended gel shots and patient is agreeable.  We will seek insurance approval.  Knee compression sleeve provided.  Start Voltaren gel (OTC) as needed. Tylenol arthritis 3 times daily for 7 days.  Follow up for gel shots.     69, DO PGY-2, [facility] 08/01/20     Resident Attestation   I saw and evaluated the patient, performing the key elements of the service. I personally performed or re-performed the history, physical exam, and medical decision making activities of this service and have verified that the service and findings are accurately documented in the resident's note. I developed the management plan that is described in the resident's note, and I agree with the content, with my edits above.  09/29/20, DO [facility], PGY-4  I was a preceptor for this visit and available for immediate consultation Jules Schick, DO

## 2020-08-01 NOTE — Progress Notes (Deleted)
    SUBJECTIVE:   CHIEF COMPLAINT / HPI:   ***  PERTINENT  PMH / PSH: ***  OBJECTIVE:   BP 126/70   Ht 5\' 7"  (1.702 m)   Wt 173 lb (78.5 kg)   BMI 27.10 kg/m   .evsadult .evskid ***  ASSESSMENT/PLAN:   No problem-specific Assessment & Plan notes found for this encounter.     , DO PGY-4, Sports Medicine Fellow [facility]

## 2020-08-01 NOTE — Patient Instructions (Signed)
It was great to see you today! Thank you for letting me participate in your care!  Today, we discussed your right knee pain which did not get better with steroid injections. We will work on Clinical biochemist for [employer] injections and I hope after this series of injections you will get good relief.  We will call you and set up your first appointment when insurance has approved your injections.  Be well, Jodi Schick, DO PGY-4, Sports Medicine Fellow [facility]

## 2020-08-15 ENCOUNTER — Other Ambulatory Visit: Payer: Self-pay

## 2020-08-15 ENCOUNTER — Ambulatory Visit (INDEPENDENT_AMBULATORY_CARE_PROVIDER_SITE_OTHER): Payer: Medicare Other | Admitting: Family Medicine

## 2020-08-15 VITALS — BP 130/84 | Ht 67.0 in | Wt 180.0 lb

## 2020-08-15 DIAGNOSIS — M1711 Unilateral primary osteoarthritis, right knee: Secondary | ICD-10-CM

## 2020-08-15 NOTE — Assessment & Plan Note (Signed)
Patient presenting today for the first in a series of 3 Orthovisc injections performed by myself and procedure dictated below. She will return in 1 week for the second in a series of 3 injections.

## 2020-08-15 NOTE — Progress Notes (Addendum)
    SUBJECTIVE:   CHIEF COMPLAINT / HPI:   Primary OA of Right Knee Jodi Mcgrath is here for a gel injection of her right knee due to pain from osteoarthritis of the right knee.   OBJECTIVE:   BP 130/84   Ht 5\' 7"  (1.702 m)   Wt 180 lb (81.6 kg)   BMI 28.19 kg/m    Gen: NAD MSK: Right knee has mild effusion, full ROM. Mildly tender over medial and lateral joint line.  ASSESSMENT/PLAN:   Osteoarthritis of right knee Patient presenting today for the first in a series of 3 Orthovisc injections performed by myself and procedure dictated below. She will return in 1 week for the second in a series of 3 injections.   Written and verbal consent was obtained after discussing the risks and benefits of the procedure with the patient. A timeout was performed and the correct site and side was identified and confirmed.  The right knee was cleaned in sterile fashion with alcohol pad. 3 cc 1% Lidocaine was injected using an anterolateral approach using a 5 cc syringe and 25 gauge 1 and 1/2 in needle. Then I connected the orthovisc syringe and injected the Orthovisc solution. No complications were encountered. Minimal blood loss. A band aid was applied.    , DO PGY-4, Sports Medicine Fellow [facility]  Addendum:  I was the preceptor for this visit and available for immediate consultation.  [facility] MD Norton Blizzard

## 2020-08-22 ENCOUNTER — Ambulatory Visit (INDEPENDENT_AMBULATORY_CARE_PROVIDER_SITE_OTHER): Payer: Medicare Other | Admitting: Family Medicine

## 2020-08-22 ENCOUNTER — Other Ambulatory Visit: Payer: Self-pay

## 2020-08-22 DIAGNOSIS — M1711 Unilateral primary osteoarthritis, right knee: Secondary | ICD-10-CM | POA: Diagnosis present

## 2020-08-22 NOTE — Assessment & Plan Note (Addendum)
Second injection of Orthovisc today. Will return in 1 week to get the last injection and then follow up after in 2-3 weeks.

## 2020-08-22 NOTE — Patient Instructions (Signed)
declined

## 2020-08-22 NOTE — Progress Notes (Addendum)
    SUBJECTIVE:   CHIEF COMPLAINT / HPI:   Primary OA of right knee Ms. Duddy is here for a gel injection of her right knee due to pain from osteoarthritis of the right knee.  This is the second in a series of 3 injections.   OBJECTIVE:   BP 112/82   Ht 5\' 7"  (1.702 m)   Wt 180 lb (81.6 kg)   BMI 28.19 kg/m   [facility] Adult [facility] 06/27/2020 08/01/2020  Frequency of aerobic exercise (# of days/week) 0 4  Average time in minutes 0 30  Frequency of strengthening activities (# of days/week) 0 2    Gen: NAD MSK: Right knee with medial side scar from previous injury. No erythema, no warmth, no obvious effusion. No TTP at the lateral or medial joint line.  ASSESSMENT/PLAN:   Osteoarthritis of right knee Second injection of Orthovisc today. Will return in 1 week to get the last injection and then follow up after in 2-3 weeks.   Written and verbal consent was obtained after discussing the risks and benefits of the procedure with the patient. A timeout was performed and the correct site and side was identified and confirmed.  The right knee was cleaned in sterile fashion with alcohol pad. 3 cc 1% Lidocaine was injected using an anterolateral approach using a 5 cc syringe and 25 gauge 1 and 1/2 in needle. Then I connected the orthovisc syringe and injected the Orthovisc solution. No complications were encountered. Minimal blood loss. A band aid was applied.    09/29/2020, DO PGY-4, Sports Medicine Fellow [facility]  Addendum:  I was the preceptor for this visit and available for immediate consultation.  [facility] MD Norton Blizzard

## 2020-08-23 NOTE — Addendum Note (Signed)
Addended by: Lenda Kelp on: 08/23/2020 11:48 AM   Modules accepted: Level of Service

## 2020-08-29 ENCOUNTER — Ambulatory Visit: Payer: Medicare Other | Admitting: Family Medicine

## 2020-08-31 ENCOUNTER — Ambulatory Visit (INDEPENDENT_AMBULATORY_CARE_PROVIDER_SITE_OTHER): Payer: Medicare Other | Admitting: Family Medicine

## 2020-08-31 ENCOUNTER — Other Ambulatory Visit: Payer: Self-pay

## 2020-08-31 ENCOUNTER — Encounter: Payer: Self-pay | Admitting: Family Medicine

## 2020-08-31 DIAGNOSIS — M1711 Unilateral primary osteoarthritis, right knee: Secondary | ICD-10-CM

## 2020-08-31 NOTE — Patient Instructions (Signed)
Today I gave you an injection in your knee. This is a third and final one of this set. I hope it does well for you. We discussed primary care physicians. I will have them send you a new patient packet from the residency practice. If you do not get that in the next week or so, you can call them at [phone]. I think there is a short waiting list. We have to faculty who are still billing the practice. Dr. Manson Passey and Dr. Miquel Dunn. I think you would like either one of them. Was great to meet you!

## 2020-08-31 NOTE — Assessment & Plan Note (Signed)
Third injection of Orthovisc Follow-up 3 to 4 weeks, sooner with problems.

## 2020-08-31 NOTE — Progress Notes (Signed)
  KASHINA MECUM - 65 y.o. female MRN [id number]  Date of birth: 1956/05/20    SUBJECTIVE:      Chief Complaint:/ HPI:    Right knee pain Here for her third Orthovisc injection.  No problems with the previous ones.  She has not seen any significant improvement yet but she has had no side effects.   OBJECTIVE: BP 126/84   Ht 5\' 7"  (1.702 m)   Wt 174 lb (78.9 kg)   BMI 27.25 kg/m   Physical Exam:  Vital signs are reviewed. X-rays reveal medial joint space narrowing. GENERAL: Well-developed female no acute distress KNEE: Well-healed medial scar.  Full range of motion extension and flexion. PROCEDURE: INJECTION: Patient was given informed consent, signed copy in the chart. Appropriate time out was taken. Area prepped and draped in usual sterile fashion. Ethyl chloride was  used for local anesthesia. A 21 gauge 1 1/2 inch needle was used to instill 4 cc of 1% lidocaine, then the syringe was removed from the lower lock, leaving the needle in place and switched out with the Orthovisc syringe and Orthovisc was injected into the knee using a medial approach.  Patient tolerated procedure well.    There were no complications. Post procedure instructions were given.   ASSESSMENT & PLAN:  See problem based charting & AVS for pt instructions. Osteoarthritis of right knee Third injection of Orthovisc Follow-up 3 to 4 weeks, sooner with problems.

## 2020-09-04 ENCOUNTER — Ambulatory Visit: Payer: Medicare Other

## 2020-09-05 ENCOUNTER — Ambulatory Visit: Payer: Medicare Other | Admitting: Internal Medicine

## 2020-09-26 ENCOUNTER — Ambulatory Visit: Payer: Medicare Other | Admitting: Family Medicine

## 2020-10-01 NOTE — Progress Notes (Signed)
SUBJECTIVE:   CHIEF COMPLAINT / HPI:   Concerns Today:  PMH: Sensironeural hearing loss- with hearing aide Episodic Vertigo/imbalance- has been going on for 4 years, had MRI in [place] in 2018 that was normal does not have report with her today, doing physical therapy, follows with ENT Dr Doran Heater, recommended MRI but she declined and is now reconsidering, she is claustrophobic and nervous about MRI, planned to follow up possibly with vestibular evaluation with [name] in [facility] if not better, on Valium and Zofran PRN, Valium helps but really just knocks her out PRN meclizine not been using, 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 She is tearful when discussing this as it impacts her quality of life and sometimes she is afraid to go places and drive for fear of having an episode. She also has vomiting when the vertigo is severe. No vertigo currently at appointment. SVT- on metoprolol 25mg  once daily, s/p ablation in 2012, denies chest pain or palpitations Lyme disease- with residual joint pain, diagnosed in 2006 Osteoporosis- DEXA scan 08/09/2019 lumbar spine T score -2.5, never started Fosamax concerned about side effects Osteoarthritis- diagnosed in 2020, getting knee injections of Orthovisc now completed 3/3 Depression- hospitalized in 1994 for depression, 30 days, did a lot of outpatient therapy, diagnosed 1970s to present, does not take medications Kidney stones- 2006  Surgeries: Lithotripsy 2006 Hysterectomy in 2014 Cardiac ablation 2012 Cartilage repair R knee in 1975  Gynecology: 1976 - both vaginal, 2nd pregnancy early labor, placenta previa that resolved  Medications: Metoprolol 25mg  daily Diazepam 2mg  q6hr PRN vertigo Zofran 4mg  q8hr PRN nausea  Allergies: Mucinex (rash) Methylprednisolone (flushing, headache)  Social History Tobacco: never Alcohol: denies Other substances: denies Home situation: lives w  husband Work: retired, use to work for [id number]  HCM Colonoscopy - never had, no family history of first degree relatives with colon cancer Tetanus given in 2021 per patient Received Shingles shot in 2021 as well COVID/flu vaccine this year both Hep C screening due  PERTINENT  PMH / PSH: as above  OBJECTIVE:   BP 118/76    Pulse 73    Ht 5\' 7"  (1.702 m)    Wt 172 lb 9.6 oz (78.3 kg)    SpO2 96%    BMI 27.03 kg/m   General: A&O, NAD HEENT: No sign of trauma, EOM intact, PERRL, right beating horizonal nystamus with lateral gaze (unilateral beating nystagmus) Cardiac: RRR, no m/r/g Respiratory: CTAB, normal WOB, no w/c/r GI: Soft, NTTP, non-distended  Extremities: NTTP, no peripheral edema. Neuro: Normal gait, moves all four extremities appropriately. Psych: Appropriate mood and affect   ASSESSMENT/PLAN:   Episodic recurrent vertigo - 4 year history, with reported normal MRI at start of episodes, had resolution for 6 months but now occurs fairly frequently - will review ENT records, PRN meclizine resent as patient notes concern with using Valium and would like to try a different medication to see if helps with episodes - She is considering repeating MRI as had been recommended by ENT, states she would need sedation due to claustrophobia with this, we discussed that I would agree with their recommendation if they want her to repeat it - Requesting referral to neurology for second opinion, referral placed today  SVT (supraventricular tachycardia) (HCC) - stable without symptoms, only taking metoprolol tartrate once daily, consider switching to succinate at f/u appt for once daily dosing with full day coverage  Osteoporosis - never started Fosfamax,  will discuss treatment options at follow-up appt, would recommend Vit D ([phone] IU/day) and calcium 1200mg /day    Will schedule f/u at her convenience in 2-4 weeks to discuss HCM items including osteoporosis and need for  colonscopy/Cologuard, Hep C screening due as well.  , MD [facility]

## 2020-10-02 DIAGNOSIS — H905 Unspecified sensorineural hearing loss: Secondary | ICD-10-CM | POA: Insufficient documentation

## 2020-10-02 DIAGNOSIS — M810 Age-related osteoporosis without current pathological fracture: Secondary | ICD-10-CM | POA: Insufficient documentation

## 2020-10-02 DIAGNOSIS — Z87442 Personal history of urinary calculi: Secondary | ICD-10-CM | POA: Insufficient documentation

## 2020-10-02 HISTORY — DX: Unspecified sensorineural hearing loss: H90.5

## 2020-10-03 ENCOUNTER — Other Ambulatory Visit: Payer: Self-pay

## 2020-10-03 ENCOUNTER — Ambulatory Visit (INDEPENDENT_AMBULATORY_CARE_PROVIDER_SITE_OTHER): Payer: Medicare Other | Admitting: Family Medicine

## 2020-10-03 ENCOUNTER — Encounter: Payer: Self-pay | Admitting: Family Medicine

## 2020-10-03 VITALS — BP 118/76 | HR 73 | Ht 67.0 in | Wt 172.6 lb

## 2020-10-03 DIAGNOSIS — R42 Dizziness and giddiness: Secondary | ICD-10-CM

## 2020-10-03 DIAGNOSIS — H905 Unspecified sensorineural hearing loss: Secondary | ICD-10-CM

## 2020-10-03 DIAGNOSIS — M810 Age-related osteoporosis without current pathological fracture: Secondary | ICD-10-CM

## 2020-10-03 DIAGNOSIS — Z87442 Personal history of urinary calculi: Secondary | ICD-10-CM

## 2020-10-03 DIAGNOSIS — I471 Supraventricular tachycardia: Secondary | ICD-10-CM

## 2020-10-03 DIAGNOSIS — H8190 Unspecified disorder of vestibular function, unspecified ear: Secondary | ICD-10-CM | POA: Diagnosis not present

## 2020-10-03 DIAGNOSIS — H9312 Tinnitus, left ear: Secondary | ICD-10-CM

## 2020-10-03 NOTE — Patient Instructions (Signed)
It was wonderful to see you today.  Please bring ALL of your medications with you to every visit.   Today we talked about:  - Referral to neurology has been placed, they will call you - I will look over your records and let you know about the MRI - F/u in 2-4 weeks at your convenience to discuss other healthcare maintenance items   Thank you for choosing [facility].   Please call [phone] with any questions about today's appointment.  Please be sure to schedule follow up at the front  desk before you leave today.   Burley Saver, MD  Family Medicine

## 2020-10-04 DIAGNOSIS — H8109 Meniere's disease, unspecified ear: Secondary | ICD-10-CM | POA: Insufficient documentation

## 2020-10-04 DIAGNOSIS — H8190 Unspecified disorder of vestibular function, unspecified ear: Secondary | ICD-10-CM | POA: Insufficient documentation

## 2020-10-04 MED ORDER — MECLIZINE HCL 25 MG PO TABS: 25.0000 mg | ORAL_TABLET | Freq: Three times a day (TID) | ORAL | 0 refills | Status: DC | PRN

## 2020-10-04 NOTE — Assessment & Plan Note (Signed)
-   never started Fosfamax, will discuss treatment options at follow-up appt, would recommend Vit D [phone] IU/day) and calcium 1200mg /day

## 2020-10-04 NOTE — Assessment & Plan Note (Signed)
-   4 year history, with reported normal MRI at start of episodes, had resolution for 6 months but now occurs fairly frequently - will review ENT records, PRN meclizine resent as patient notes concern with using Valium and would like to try a different medication to see if helps with episodes - She is considering repeating MRI as had been recommended by ENT, states she would need sedation due to claustrophobia with this, we discussed that I would agree with their recommendation if they want her to repeat it - Requesting referral to neurology for second opinion, referral placed today

## 2020-10-04 NOTE — Assessment & Plan Note (Signed)
-   stable without symptoms, only taking metoprolol tartrate once daily, consider switching to succinate at f/u appt for once daily dosing with full day coverage

## 2020-10-11 ENCOUNTER — Ambulatory Visit (INDEPENDENT_AMBULATORY_CARE_PROVIDER_SITE_OTHER): Payer: Medicare Other

## 2020-10-11 ENCOUNTER — Encounter: Payer: Self-pay | Admitting: Orthopaedic Surgery

## 2020-10-11 ENCOUNTER — Ambulatory Visit (INDEPENDENT_AMBULATORY_CARE_PROVIDER_SITE_OTHER): Payer: Medicare Other | Admitting: Orthopaedic Surgery

## 2020-10-11 VITALS — Ht 67.0 in | Wt 172.0 lb

## 2020-10-11 DIAGNOSIS — M1711 Unilateral primary osteoarthritis, right knee: Secondary | ICD-10-CM

## 2020-10-11 NOTE — Progress Notes (Signed)
Office Visit Note   Patient: Jodi Mcgrath           Date of Birth: Sep 17, 1955           MRN: [id number] Visit Date: 10/11/2020              Requested by: Philip Aspen, Limmie Patricia, MD [address] [place],  [place] [address] PCP: Billey Co, MD   Assessment & Plan: Visit Diagnoses:  1. Primary osteoarthritis of right knee     Plan: Impression is end-stage right knee DJD without relief from conservative treatments such as injections, medications, activity modifications, knee brace, home exercise program.  We had a detailed discussion today about her options which include continuing with conservative treatment versus a total knee replacement and the associated risk benefits rehab recovery.  Based on discussion she would like to proceed with a right total knee replacement in the next few months.  We will get the necessary clearances from primary care doctor.  Questions encouraged and answered.  We look forward to treating her in the operating room.  Follow-Up Instructions: Return if symptoms worsen or fail to improve.   Orders:  Orders Placed This Encounter  Procedures  . XR KNEE 3 VIEW RIGHT   No orders of the defined types were placed in this encounter.     Procedures: No procedures performed   Clinical Data: No additional findings.   Subjective: Chief Complaint  Patient presents with  . Right Knee - Pain    Jodi Mcgrath is a very pleasant 65 year old female who is the mother-in-law of Dr. Barbaraann Cao who comes in to see me today for evaluation of chronic and severe right knee pain for about a year.  She endorses particularly pain on the medial and superior aspect of the knee.  She has been unable to be active due to the pain.  She enjoys walking and riding bikes but she has been unable to do so.  She denies any mechanical symptoms.  Tylenol gives temporary relief.  Voltaren gel gives temporary relief.  Mobic has not helped.  She has start up stiffness.  She has  had cortisone injections and Visco injections when she lived in [place] and she did not notice any significant improvement or long-lasting relief.  She has 5 grandkids that she would like to be active with but has not been able to do so due to the knee.   Review of Systems  Constitutional: Negative.   HENT: Negative.   Eyes: Negative.   Respiratory: Negative.   Cardiovascular: Negative.   Endocrine: Negative.   Musculoskeletal: Negative.   Neurological: Negative.   Hematological: Negative.   Psychiatric/Behavioral: Negative.   All other systems reviewed and are negative.    Objective: Vital Signs: Ht 5\' 7"  (1.702 m)   Wt 172 lb (78 kg)   BMI 26.94 kg/m   Physical Exam Vitals and nursing note reviewed.  Constitutional:      Appearance: She is well-developed.  HENT:     Head: Normocephalic and atraumatic.  Pulmonary:     Effort: Pulmonary effort is normal.  Abdominal:     Palpations: Abdomen is soft.  Musculoskeletal:     Cervical back: Neck supple.  Skin:    General: Skin is warm.     Capillary Refill: Capillary refill takes less than 2 seconds.  Neurological:     Mental Status: She is alert and oriented to person, place, and time.  Psychiatric:  Behavior: Behavior normal.        Thought Content: Thought content normal.        Judgment: Judgment normal.     Ortho Exam Right knee shows no joint effusion.  She has a prior fully healed surgical scar from the surgery when she was a child.  She has 1+ patellofemoral crepitus with range of motion which is mildly restricted.  She has moderate pain with range of motion.  Collaterals and cruciates are stable.  Medial joint line tenderness. Specialty Comments:  No specialty comments available.  Imaging: XR KNEE 3 VIEW RIGHT  Result Date: 10/11/2020 Moderate tricompartmental degenerative joint disease    PMFS History: Patient Active Problem List   Diagnosis Date Noted  . Episodic recurrent vertigo  10/04/2020  . History of kidney stones 10/02/2020  . Osteoporosis 10/02/2020  . Sensorineural hearing loss 10/02/2020  . Osteoarthritis of right knee 06/27/2020  . SVT (supraventricular tachycardia) (HCC) 07/26/2019  . Overweight (BMI 25.0-29.9) 07/26/2019  . H/o Lyme disease    Past Medical History:  Diagnosis Date  . H/o Lyme disease   . SVT (supraventricular tachycardia) (HCC)     Family History  Problem Relation Age of Onset  . CAD Father   . Non-Hodgkin's lymphoma Father   . CAD Brother   . Diabetes Brother   . Diabetes Brother     Past Surgical History:  Procedure Laterality Date  . LITHOTRIPSY     Social History   Occupational History  . Not on file  Tobacco Use  . Smoking status: Never Smoker  . Smokeless tobacco: Never Used  Substance and Sexual Activity  . Alcohol use: Never  . Drug use: Never  . Sexual activity: Not on file

## 2020-11-07 ENCOUNTER — Telehealth: Payer: Self-pay | Admitting: Family Medicine

## 2020-11-07 NOTE — Telephone Encounter (Signed)
Patient would like for Dr. Miquel Dunn to call her to discuss her surgical clearance. She said that Dr. Miquel Dunn told her she would call to discuss but hasnt received a call.   I informed patient that she would most likely need an appointment and she states that Dr. Miquel Dunn said she would do this over a phone call.   Please call back to discuss.  [phone]

## 2020-11-09 ENCOUNTER — Telehealth: Payer: Self-pay | Admitting: Family Medicine

## 2020-11-09 DIAGNOSIS — I471 Supraventricular tachycardia: Secondary | ICD-10-CM

## 2020-11-09 DIAGNOSIS — R0789 Other chest pain: Secondary | ICD-10-CM

## 2020-11-09 MED ORDER — METOPROLOL SUCCINATE ER 50 MG PO TB24: 50.0000 mg | ORAL_TABLET | Freq: Every day | ORAL | 3 refills | Status: DC

## 2020-11-09 NOTE — Telephone Encounter (Signed)
Called and left VM to call back to discuss preop clearance. Left clinic number to call back, will try again later.  Burley Saver MD

## 2020-11-09 NOTE — Telephone Encounter (Signed)
Called and spoke with Jodi Mcgrath about pre-operative evaluation for her upcoming surgery.  Surgical Pre-operative Evaluation:  Procedure: Right total knee arthroplasty  Procedural risk: Intermediate risk   Anesthesia: spinal and block  PMH: SVT- s/p ablation in 2012, on metoprolol tartrate 25mg  but only once daily, denies chest pain, palpitations, shortness of breath Episodic vertigo- on PRN Valium Depression- no medications H/o kidney stones Osteoporosis- not on treatment Osteoarthritis  Surgical History:  Lithotripsy 2006 Hysterectomy in 2014 Cardiac ablation 2012 Cartilage repair R knee in 1975  The patient denies any complication with anesthesia, bleeding, VTE, or post-operative confusion, nausea, or vomiting in prior surgical interventions. The patient denies any history of spinal surgeries.  Family History: The patient denies any family history of complications with anesthesia and VTE.  Social History: Tobacco: never Alcohol: denies Other substances: denies  Screening for OSA: STOP BANG score of 2, low risk of OSA  Medication Adjustments:  - Continue home beta blocker- discussed switching to once daily metoprolol succinate for full day coverage, will switch to Toprol XL 50mg  daily - Continue PRN Valium- does not take daily, only taking with episodes - Meclizine- taking PRN  Risk Stratification Tool: 1976 Calculator: 0.1 % risk of MI or cardiac event, intraoperatively or up to 30 days post-op  Functional Capacity:  When she walks around the neighborhood, has sometimes some chest tightness, will go away when she rests. Only happens will hills and stairs, can walk a mile a day with gardening. Happens when she walks up two flights of stairs. Last stress test 6 years ago, treadmill stress, was told it was normal. Due to chest tightness with METS <4, I do think she warrants evaluation by cardiology and potential stress test prior to surgery.  Labs to Check: CBC, BMP,  urinalysis  Imaging: Recommend ECG  Final Assessment:  1. I recommend the following additional tests: CBC, BMP, urinalysis, ECG to be done at appointment with myself 11/23/2020. Will refer to cardiology today for considering for stress testing for stable angina symptoms prior to surgery. 2. I recommend the following changes to medications in the perioperative setting: Continue home beta blocker day of surgery, we switched to long acting once daily metoprolol succinate today.

## 2020-11-23 ENCOUNTER — Encounter: Payer: Self-pay | Admitting: Family Medicine

## 2020-11-23 ENCOUNTER — Ambulatory Visit (INDEPENDENT_AMBULATORY_CARE_PROVIDER_SITE_OTHER): Payer: Medicare Other | Admitting: Family Medicine

## 2020-11-23 ENCOUNTER — Other Ambulatory Visit: Payer: Self-pay

## 2020-11-23 ENCOUNTER — Ambulatory Visit (HOSPITAL_COMMUNITY)
Admission: RE | Admit: 2020-11-23 | Discharge: 2020-11-23 | Disposition: A | Discharge status: Home / Self Care | Payer: Medicare Other | Source: Ambulatory Visit | Attending: Family Medicine | Admitting: Family Medicine

## 2020-11-23 VITALS — BP 126/62 | HR 79 | Ht 67.0 in | Wt 175.4 lb

## 2020-11-23 DIAGNOSIS — H8190 Unspecified disorder of vestibular function, unspecified ear: Secondary | ICD-10-CM | POA: Diagnosis not present

## 2020-11-23 DIAGNOSIS — Z01818 Encounter for other preprocedural examination: Secondary | ICD-10-CM | POA: Insufficient documentation

## 2020-11-23 DIAGNOSIS — I471 Supraventricular tachycardia: Secondary | ICD-10-CM | POA: Insufficient documentation

## 2020-11-23 DIAGNOSIS — M810 Age-related osteoporosis without current pathological fracture: Secondary | ICD-10-CM | POA: Diagnosis not present

## 2020-11-23 DIAGNOSIS — Z01810 Encounter for preprocedural cardiovascular examination: Secondary | ICD-10-CM | POA: Diagnosis present

## 2020-11-23 DIAGNOSIS — Z1211 Encounter for screening for malignant neoplasm of colon: Secondary | ICD-10-CM | POA: Diagnosis not present

## 2020-11-23 HISTORY — DX: Encounter for other preprocedural examination: Z01.818

## 2020-11-23 LAB — POCT URINALYSIS DIP (MANUAL ENTRY)
Bilirubin, UA: NEGATIVE
Glucose, UA: NEGATIVE mg/dL
Nitrite, UA: NEGATIVE
Protein Ur, POC: NEGATIVE mg/dL
Spec Grav, UA: >=1.03 — AB (ref 1.010–1.025)
Urobilinogen, UA: 0.2 E.U./dL
pH, UA: 5 (ref 5.0–8.0)

## 2020-11-23 NOTE — Patient Instructions (Addendum)
It was wonderful to see you today.  Please bring ALL of your medications with you to every visit.   Today we talked about:  - Referral for discussion of other treatments of osteoporosis- I will place today - Vitamin D 800 IU daily and calcium 1200 mg daily, and continue your weight bearing exercise - Urinalysis and lab work today, will let you know if results abnormal and you can see message in patient portal - Your EKG today was normal - Cologuard ordered, they will call you and ship to you  Thank you for choosing [facility].   Please call [phone] with any questions about today's appointment.  Please be sure to schedule follow up at the front  desk before you leave today.   Burley Saver, MD  Family Medicine

## 2020-11-23 NOTE — Progress Notes (Signed)
SUBJECTIVE:   CHIEF COMPLAINT / HPI:   Surgical Pre-operative Evaluation:  Procedure: Right total knee arthroplasty         Procedural risk: Intermediate risk   Anesthesia: spinal and block  PMH: SVT- s/p ablation in 2012, on metoprolol tartrate 25mg  but only once daily, denies chest pain, palpitations, shortness of breath- tried metoprolol succinate 50mg  daily once and had an episode of tinnitus/dizziness so discontinued Episodic vertigo- on PRN Valium Depression- no medications H/o kidney stones Osteoporosis- not on treatment, has never tried, history of poor dentition and multiple cavities and is worried about osteonecrosis of the jaw, no current cavities now Osteoarthritis  Surgical History:  Lithotripsy2006 Hysterectomy in 2014 Cardiac ablation 2012 Cartilage repairR kneein 1975  The patient denies any complication with anesthesia, bleeding, VTE, or post-operative confusion, nausea, or vomiting in prior surgical interventions. The patient denies any history of spinal surgeries.  Family History: The patient denies any family history of complications with anesthesia and VTE.  Social History: Tobacco:never Alcohol:denies Other substances:denies  Screening for OSA: STOP BANG score of 2, low risk of OSA  Medication Adjustments:  - Tried metoprolol succinate 50mg  daily, but had tinnitus/dizziness with it, she has resumed her metoprolol tartrate 25mg  once daily, will plan to discuss with cardiology at upcoming appointment - Continue PRN Valium- does not take daily, only taking with episodes - Meclizine- taking PRN  Risk Stratification Tool: 2015 Calculator: 0.1 % risk of MI or cardiac event, intraoperatively or up to 30 days post-op  Functional Capacity:  When she walks around the neighborhood, has sometimes some chest tightness, will go away when she rests. Only happens will hills and stairs, can walk a mile a day with gardening. Happens when she  walks up two flights of stairs. Last stress test 6 years ago, treadmill stress, was told it was normal. Due to chest tightness with METS <4, I do think she warrants evaluation by cardiology and potential stress test prior to surgery. Has appt scheduled with cardiology next month.  Labs to Check: CBC, BMP, urinalysis  Imaging: ECG today- NSR with first degree AV block. No ST segment changes or T wave inversions.     Vertigo/tinnitus- follows with ENT. Recent repeat MRI normal. Worsening of tinnitus since switching to metoprolol succinate, but resolved when when back to tartrate.   H/o SVT- s/p ablation. Swtiched from metoprolol tartrate as she was only taking once daily to metoprolol succinate, but had worsening tinnitus as above so back on metoprolol tartrate once daily. chest pressure noted with physical activity, has appt with cardiology 6/6 to consider stress test before her knee replacement.  Osteoporosis- DEXA 07/2019 with T score - 2.5 on lumbar spine. Recommend calcium 1200 mg/day and vitamin D 800 IU/day. Treatment is indicated, she has not tried due to concerns for osteonecrosis of the jaw due to her history of poor dentition, no current cavities. Interested in referral and discussing other options as our office doesn't do Prolia here.    PERTINENT  PMH / PSH: as above  OBJECTIVE:   BP 126/62   Pulse 79   Ht 5\' 7"  (1.702 m)   Wt 175 lb 6.4 oz (79.6 kg)   SpO2 99%   BMI 27.47 kg/m   General: A&O, NAD HEENT: No sign of trauma, EOM grossly intact Cardiac: RRR, no m/r/g Respiratory: CTAB, normal WOB, no w/c/r GI: Soft, NTTP, non-distended  Extremities: NTTP, no peripheral edema. Neuro: Normal gait, moves all four extremities appropriately. Psych: Appropriate mood  and affect   ASSESSMENT/PLAN:   Preoperative evaluation to rule out surgical contraindication Final Assessment:  1. I recommend the following additional tests: CBC, BMP unremarkable. Urinalysis with trace  blood, she will repeat specimen to ensure no microscopic hematuria but no signs of infection. ECG showing first degree AV block but no other abnormality. Has appt with cardiology for discussion of beta blocker and consideration of stress testing for stable angina symptoms. Pending final clearance based on cardiology recommendations 2. I recommend the following changes to medications in the perioperative setting: Continue home beta blocker day of surgery, currently on metoprolol tartrate once daily due to worsening tinnitus when attempting to switch to succinate.  Osteoporosis - declining bisphosphenate therapy due to the risk of osteonecrosis with her history of poor dentition (no current issues) - referral to endocrinology, will also discuss with our pharmacy team if we can do Prolia in our clinic though not currently able to - continue weight bearing exercises, vit D and calcium daily recommended  Episodic recurrent vertigo - recent repeat MRI negative, following with ENT, only one episode since last visit when she tried metoprolol succinate, not that she is back on metoprolol tartrate no further episodes  SVT (supraventricular tachycardia) (HCC) - s/p ablation, no symptoms since then - tried to switch to once daily metoprolol succinate as she was only taking metoprolol tartrate once daily, she had episodes of tinnitus/vertigo so did not try again- we discussed I am not sure that this was due to succinate as she only took it once but reasonable to hold and discuss with cardiology at upcoming appointment- she may not even need metoprolol tartrate as she is only taking 25 mg once daily without recurrence of symptoms - appreciate cardiology expertise    Billey Co, MD [facility]

## 2020-11-24 LAB — CBC
Hematocrit: 40 % (ref 34.0–46.6)
Hemoglobin: 13.7 g/dL (ref 11.1–15.9)
MCH: 28.8 pg (ref 26.6–33.0)
MCHC: 34.3 g/dL (ref 31.5–35.7)
MCV: 84 fL (ref 79–97)
Platelets: 385 10*3/uL (ref 150–450)
RBC: 4.76 x10E6/uL (ref 3.77–5.28)
RDW: 12.9 % (ref 11.7–15.4)
WBC: 6.9 10*3/uL (ref 3.4–10.8)

## 2020-11-24 LAB — BASIC METABOLIC PANEL
BUN/Creatinine Ratio: 21 (ref 12–28)
BUN: 16 mg/dL (ref 8–27)
CO2: 21 mmol/L (ref 20–29)
Calcium: 9.6 mg/dL (ref 8.7–10.3)
Chloride: 101 mmol/L (ref 96–106)
Creatinine, Ser: 0.77 mg/dL (ref 0.57–1.00)
Glucose: 80 mg/dL (ref 65–99)
Potassium: 4.6 mmol/L (ref 3.5–5.2)
Sodium: 140 mmol/L (ref 134–144)
eGFR: 84 mL/min/{1.73_m2} (ref >59)

## 2020-11-24 NOTE — Assessment & Plan Note (Signed)
-   s/p ablation, no symptoms since then - tried to switch to once daily metoprolol succinate as she was only taking metoprolol tartrate once daily, she had episodes of tinnitus/vertigo so did not try again- we discussed I am not sure that this was due to succinate as she only took it once but reasonable to hold and discuss with cardiology at upcoming appointment- she may not even need metoprolol tartrate as she is only taking 25 mg once daily without recurrence of symptoms - appreciate cardiology expertise

## 2020-11-24 NOTE — Assessment & Plan Note (Signed)
-   declining bisphosphenate therapy due to the risk of osteonecrosis with her history of poor dentition (no current issues) - referral to endocrinology, will also discuss with our pharmacy team if we can do Prolia in our clinic though not currently able to - continue weight bearing exercises, vit D and calcium daily recommended

## 2020-11-24 NOTE — Assessment & Plan Note (Addendum)
Final Assessment:  1. I recommend the following additional tests: CBC, BMP unremarkable. Urinalysis with trace blood, she will repeat specimen to ensure no microscopic hematuria but no signs of infection. ECG showing first degree AV block but no other abnormality. Has appt with cardiology for discussion of beta blocker and consideration of stress testing for stable angina symptoms. Pending final clearance based on cardiology recommendations 2. I recommend the following changes to medications in the perioperative setting: Continue home beta blocker day of surgery, currently on metoprolol tartrate once daily due to worsening tinnitus when attempting to switch to succinate.

## 2020-11-24 NOTE — Assessment & Plan Note (Signed)
-   recent repeat MRI negative, following with ENT, only one episode since last visit when she tried metoprolol succinate, not that she is back on metoprolol tartrate no further episodes

## 2020-12-06 ENCOUNTER — Ambulatory Visit (INDEPENDENT_AMBULATORY_CARE_PROVIDER_SITE_OTHER): Payer: Medicare Other | Admitting: Neurology

## 2020-12-06 ENCOUNTER — Encounter: Payer: Self-pay | Admitting: Neurology

## 2020-12-06 VITALS — BP 153/92 | HR 67 | Ht 66.0 in | Wt 178.3 lb

## 2020-12-06 DIAGNOSIS — G479 Sleep disorder, unspecified: Secondary | ICD-10-CM | POA: Diagnosis not present

## 2020-12-06 DIAGNOSIS — H8190 Unspecified disorder of vestibular function, unspecified ear: Secondary | ICD-10-CM | POA: Diagnosis not present

## 2020-12-06 NOTE — Patient Instructions (Addendum)
It was nice to meet you today.  Your history suggests episodic and positional vertigo.   Your neurological exam is normal, you also had a brain MRI which showed benign findings and no structural cause of your vertigo.  Your history and exam are not in keeping with stroke or TIA or any degenerative neurological disease.  Your history does not suggest migraines as an underlying cause of your vertigo.  Please remember, that vertigo can recur without warning, triggers could be stress, sleep deprivation, dehydration and even taking a bumpy car ride, or train ride or cruise, sometimes an acute illness, even a common (viral) cold. It can last hours or days. Please change positions slowly and always stay well-hydrated. Physical therapy with particular attention to vestibular rehabilitation can be very helpful. While there is no specific medication that helps with vertigo, some people get relief with as needed use of meclizine.   Please try to hydrate better with water, 6 to 8 cups of water a recommended daily, 8 ounce size each.  Limit your caffeine to 1 or 2 servings per day on average.  Try to get 7 to 8 hours of sleep.  If you have ongoing sleep disturbance and if you have been noted to snore by your husband or family, we can certainly look into an underlying sleep disorder with a sleep study, obstructive sleep apnea can cause sleep disruption and nonrestorative sleep and as such as a result may cause daytime symptoms including nonspecific symptoms such as dizziness, recurrent headaches in the mornings or at night, having to get up to use the bathroom at night more frequently than during the day and of course daytime tiredness.  You can follow-up with your ENT specialist as well as primary care physician.

## 2020-12-06 NOTE — Progress Notes (Signed)
Subjective:    Patient ID: Jodi Mcgrath is a 65 y.o. female.  HPI     Huston Foley, MD, PhD [facility] [address], [address] [address] [place], [place] [address]  Dear Dr. Miquel Dunn,   I saw your patient, Jodi Mcgrath, upon your Kind request in my neurologic clinic today for second opinion of her recurrent vertigo.  The patient is unaccompanied today.  As you know, Jodi Mcgrath is a 65 year old right-handed woman with an underlying medical history of SVT, hearing loss, osteoporosis, arthritis, history of kidney stone, history of Lyme disease, depression and overweight state, who reports recurrent episodes of vertigo for the past approximately 4 years, symptoms started in 2018, shortly before she moved from [place] to [place] to be closer to her daughter and help them with their 5 children.  She had seen an ENT and [place] before her move and she has been followed by ENT in [place] and last saw Dr. Doran Heater in March 2022 and I reviewed the note and recommendations. Further formal vestibular evaluation was considered as well as a repeat brain MRI which the patient declined at the time. The patient has had physical therapy, she has been treated symptomatically with Zofran and Valium.  I reviewed your office note from 11/23/2020.  She had an MRI of the brain in 2018 which reportedly was normal.  The patient brought in a copy of the MRI report.  Study was done through [facility] on 06/19/2017: Impression: Minimal nonspecific white matter signal abnormality with broad differential, most likely the result of mild old microangiopathic ischemic process.  Otherwise, unremarkable enhanced MRI of the brain and IAC.  No vestibular schwannoma.  No focal findings to suggest etiology of patient's symptoms.  She also had a recent brain MRI with and without contrast through [facility] on 11/14/2020 and I reviewed the results:  IMPRESSION:  1. No acute  intracranial abnormality or abnormal contrast  enhancement.  2. A few scattered foci of T2 hyperintensity within the white matter  of the cerebral hemispheres, nonspecific but may represent mild  chronic microvascular ischemic changes.  3. Small fluid level within the left sphenoid sinus. Correlate for  acute sinusitis.    She uses meclizine as needed which is modestly helpful.  She has not found Valium to be helpful, Zofran generic helps with the nausea and vomiting.  She feels that her symptoms are erratic, sometimes may last for 2 weeks.  Most recent episode was after she came from a trip to [place], they had driven there, typically her husband drives.  She is retired, she drinks caffeine in limitation, to cups of coffee per day, no alcohol, non-smoker.  She does report stress in the past few years secondary to the move and helping taking care of her 5 grandchildren.  She does not have a history of recurrent headaches or migraines growing up, no headaches in the mornings typically.  She does admit to not sleeping very well.  She has some difficulty going to sleep but primarily is a restless sleeper and has difficulty maintaining sleep.  She is not sure that she snores.  She has never had a sleep study.  She has had hearing loss for years, she has bilateral hearing aids.  She has tinnitus also for years, left more than right.  She admits to not necessarily hydrating well with water, estimates that she drinks about 2 to 3 cups of water per day.  Her Past Medical History  Is Significant For: Past Medical History:  Diagnosis Date  . Depression   . H/o Lyme disease   . Kidney stone   . Osteoarthritis   . Osteoarthritis of right knee 06/27/2020  . Osteoporosis   . Overweight (BMI 25.0-29.9) 07/26/2019  . Preoperative evaluation to rule out surgical contraindication 11/23/2020  . Sensorineural hearing loss 10/02/2020  . SVT (supraventricular tachycardia) (HCC)     Her Past Surgical History Is  Significant For: Past Surgical History:  Procedure Laterality Date  . ABDOMINAL HYSTERECTOMY    . CARDIAC ELECTROPHYSIOLOGY STUDY AND ABLATION    . KNEE CARTILAGE SURGERY    . LITHOTRIPSY      Her Family History Is Significant For: Family History  Problem Relation Age of Onset  . CAD Father   . Non-Hodgkin's lymphoma Father   . CAD Brother   . Diabetes Brother   . Diabetes Brother     Her Social History Is Significant For: Social History   Socioeconomic History  . Marital status: Married    Spouse name: Not on file  . Number of children: Not on file  . Years of education: Not on file  . Highest education level: Not on file  Occupational History  . Not on file  Tobacco Use  . Smoking status: Never Smoker  . Smokeless tobacco: Never Used  Substance and Sexual Activity  . Alcohol use: Never  . Drug use: Never  . Sexual activity: Not on file  Other Topics Concern  . Not on file  Social History Narrative  . Not on file   Social Determinants of Health   Financial Resource Strain: Not on file  Food Insecurity: Not on file  Transportation Needs: Not on file  Physical Activity: Not on file  Stress: Not on file  Social Connections: Not on file    Her Allergies Are:  Allergies  Allergen Reactions  . Methylprednisolone Rash    Flushing, headache  . Mucinex [Guaifenesin Er] Rash  :   Her Current Medications Are:  Outpatient Encounter Medications as of 12/06/2020  Medication Sig  . meclizine (ANTIVERT) 25 MG tablet Take 1 tablet (25 mg total) by mouth 3 (three) times daily as needed for dizziness.  . metoprolol succinate (TOPROL-XL) 25 MG 24 hr tablet Take 25 mg by mouth daily.  . metoprolol tartrate (LOPRESSOR) 25 MG tablet Take 25 mg by mouth daily.  . [DISCONTINUED] metoprolol succinate (TOPROL-XL) 50 MG 24 hr tablet Take 1 tablet (50 mg total) by mouth at bedtime. Take with or immediately following a meal. (Patient not taking: Reported on 12/06/2020)   No  facility-administered encounter medications on file as of 12/06/2020.  :   Review of Systems:  Out of a complete 14 point review of systems, all are reviewed and negative with the exception of these symptoms as listed below:  Review of Systems  Neurological:       Here to discuss worsening Vertigo like sx. Pt reports sx come and go every 2-3 months but can linger for several weeks. Most recent event was in March. Pt reports she did see a ENT in [place] back in 2018 and an ENT locally. MRI recently completed.      Objective:  Neurological Exam  Physical Exam Physical Examination:   Vitals:   12/06/20 0748  BP: (!) 153/92  Pulse: 67   On orthostatic testing, standing blood pressure 148/90 with a pulse of 73, she denies any orthostatic lightheadedness or vertiginous symptoms currently.  General  Examination: The patient is a very pleasant 65 y.o. female in no acute distress. She appears well-developed and well-nourished and well groomed.   HEENT: Normocephalic, atraumatic, pupils are equal, round and reactive to light, extraocular tracking is well-preserved, no nystagmus, no changes in her symptoms with head position changes or body position changes.  No vertiginous symptoms, no lightheadedness. Extraocular tracking is good without limitation to gaze excursion or nystagmus noted. Normal smooth pursuit is noted. Hearing is grossly intact, bilateral hearing aids. Tympanic membranes are clear bilaterally. Face is symmetric with normal facial animation and normal facial sensation. Speech is clear with no dysarthria noted. There is no hypophonia. There is no lip, neck/head, jaw or voice tremor. Neck is supple with full range of passive and active motion. There are no carotid bruits on auscultation. Oropharynx exam reveals: mild mouth dryness, good dental hygiene and moderate airway crowding, due to redundant soft palate, wider uvula.  Mallampati class III.  Tongue protrudes centrally and palate  elevates symmetrically.  Chest: Clear to auscultation without wheezing, rhonchi or crackles noted.  Heart: S1+S2+0, regular and normal without murmurs, rubs or gallops noted.   Abdomen: Soft, non-tender and non-distended with normal bowel sounds appreciated on auscultation.  Extremities: There is no pitting edema in the distal lower extremities bilaterally. Pedal pulses are intact.  Skin: Warm and dry without trophic changes noted.  Musculoskeletal: exam reveals right knee discomfort, right knee is slightly larger in caliber than left knee, she has mild decrease in range of motion in the right knee.    Neurologically:  Mental status: The patient is awake, alert and oriented in all 4 spheres. Her immediate and remote memory, attention, language skills and fund of knowledge are appropriate. There is no evidence of aphasia, agnosia, apraxia or anomia. Speech is clear with normal prosody and enunciation. Thought process is linear. Mood is normal and affect is normal.  Cranial nerves II - XII are as described above under HEENT exam. In addition: shoulder shrug is normal with equal shoulder height noted. Motor exam: Normal bulk, strength and tone is noted. There is no drift, tremor or rebound. Romberg is negative. Reflexes are 2+ throughout. Babinski: Toes are flexor bilaterally. Fine motor skills and coordination: intact with normal finger taps, normal hand movements, normal rapid alternating patting, normal foot taps and normal foot agility.  Cerebellar testing: No dysmetria or intention tremor on finger to nose testing. Heel to shin is unremarkable on the left but difficult with decreased range of motion on the right due to knee pain.  There is no truncal or gait ataxia.  Sensory exam: intact to light touch in the upper and lower extremities.  Gait, station and balance: She stands easily. No veering to one side is noted. No leaning to one side is noted. Posture is age-appropriate and stance is  narrow based. Gait shows normal stride length and normal pace. No problems turning are noted. Tandem walk is unremarkable.   Assessment and Plan:   In summary, Felipa Furnaceileen M Buhman is a very pleasant 65 y.o.-year old female with an underlying medical history of SVT, hearing loss, osteoporosis, arthritis, history of kidney stone, history of Lyme disease, depression and overweight state, who presents for evaluation and second opinion of her episodic vertigo.  Her examination is not telltale for any current positional or paroxysmal vertigo.  No lightheadedness, no orthostatic hypotension.  Neurological exam is nonfocal and she is reassured in that regard.  She does not have any history of recurrent headaches, no  prior history of migraines growing up, history and presentation not suggestive of vestibular migraines, stroke, TIA, complicated migraines or an underlying neurodegenerative process.  She has undergone physical therapy.  She has had a checkup with ENT on a regular basis and was advised to consider additional vestibular testing through [facility].  She has not set this up yet.  She had a recent brain MRI which did not show any structural cause for her symptoms thankfully.  We talked about vertigo triggers.  We talked about the importance of getting enough sleep, good hydration, stress reduction.  She may be at risk for obstructive sleep apnea and was encouraged to consider sleep study.  She declines a sleep study order at this time but is willing to think about it.  If she changes her mind, she is advised to call our office.  Unfortunately, there is not a whole lot else I can offer her.  She has been on meclizine as needed, she has been using Zofran for nausea and vomiting which has helped.  She has tried Valium as needed.  She is advised that there is no specific medication that helps with vertigo.  She is encouraged to stay better hydrated with water and limit her caffeine intake.  At this juncture, she can  follow-up with your office on a scheduled basis and she is also encouraged to follow-up with ENT.  I answered all her questions today and she was in agreement.   Thank you very much for allowing me to participate in the care of this nice patient. If I can be of any further assistance to you please do not hesitate to call me at [phone].  Sincerely,   Huston Foley, MD, PhD

## 2020-12-19 ENCOUNTER — Other Ambulatory Visit (INDEPENDENT_AMBULATORY_CARE_PROVIDER_SITE_OTHER): Payer: Medicare Other

## 2020-12-19 ENCOUNTER — Other Ambulatory Visit: Payer: Self-pay

## 2020-12-19 DIAGNOSIS — Z01818 Encounter for other preprocedural examination: Secondary | ICD-10-CM | POA: Diagnosis present

## 2020-12-19 LAB — POCT URINALYSIS DIP (MANUAL ENTRY)
Bilirubin, UA: NEGATIVE
Glucose, UA: NEGATIVE mg/dL
Ketones, POC UA: NEGATIVE mg/dL
Leukocytes, UA: NEGATIVE
Nitrite, UA: NEGATIVE
Protein Ur, POC: NEGATIVE mg/dL
Spec Grav, UA: 1.02 (ref 1.010–1.025)
Urobilinogen, UA: 0.2 E.U./dL
pH, UA: 5.5 (ref 5.0–8.0)

## 2020-12-19 LAB — POCT UA - MICROSCOPIC ONLY

## 2020-12-24 ENCOUNTER — Ambulatory Visit (INDEPENDENT_AMBULATORY_CARE_PROVIDER_SITE_OTHER): Payer: Medicare Other | Admitting: Cardiology

## 2020-12-24 ENCOUNTER — Encounter: Payer: Self-pay | Admitting: Cardiology

## 2020-12-24 ENCOUNTER — Other Ambulatory Visit: Payer: Self-pay

## 2020-12-24 VITALS — BP 102/60 | HR 70 | Ht 66.0 in | Wt 175.4 lb

## 2020-12-24 DIAGNOSIS — I471 Supraventricular tachycardia: Secondary | ICD-10-CM | POA: Diagnosis not present

## 2020-12-24 DIAGNOSIS — R079 Chest pain, unspecified: Secondary | ICD-10-CM | POA: Diagnosis not present

## 2020-12-24 DIAGNOSIS — Z01810 Encounter for preprocedural cardiovascular examination: Secondary | ICD-10-CM

## 2020-12-24 DIAGNOSIS — Z01818 Encounter for other preprocedural examination: Secondary | ICD-10-CM

## 2020-12-24 DIAGNOSIS — R072 Precordial pain: Secondary | ICD-10-CM | POA: Diagnosis not present

## 2020-12-24 NOTE — Addendum Note (Signed)
Addended by: Theresia Majors on: 12/24/2020 04:37 PM   Modules accepted: Orders

## 2020-12-24 NOTE — Progress Notes (Signed)
Cardiology CONSULT Note    Date:  12/24/2020   ID:  Jodi Mcgrath, DOB 1955-08-26, MRN [id number]  PCP:  Billey Co, Mcgrath  Cardiologist:  Armanda Magic, Mcgrath   Chief Complaint  Patient presents with  . New Patient (Initial Visit)    Chest pain and preoperative clearance    History of Present Illness:  Jodi Mcgrath is a 65 y.o. female who is being seen today for the evaluation of Chest tightness walking up hills and preoperative cardiac clearance for TKR at the request of Pray, Milus Mallick, Mcgrath.  This is a 65yo female with a hx of SVT, depression and Lyme disease who Is referred for evaluation of chest tightness when walking up hills and preoperative cardiac clearance for TKR.  She tells me that the exertional chest tightness occurs mainly when walking up hills.  She has had a remote cardiac cath in [place] in 2014 that was normal.  The cath was done at that time due to SVT and had not had any CP at that time.  She tells me there is no radiation of the tighness and no associated sx of nausea or diaphoresis with the tightness.  If she stops walking the discomfort will go away.  She notices it more when she is dehydrated.    She has no hx of tobacco use but both her father and brother have CAD. She denies any SOB, DOE, palpitations, syncope, PND or orthopnea.  She has had some problems with vertigo.    Past Medical History:  Diagnosis Date  . Depression   . H/o Lyme disease   . Kidney stone   . Osteoarthritis   . Osteoarthritis of right knee 06/27/2020  . Osteoporosis   . Overweight (BMI 25.0-29.9) 07/26/2019  . Preoperative evaluation to rule out surgical contraindication 11/23/2020  . Sensorineural hearing loss 10/02/2020  . SVT (supraventricular tachycardia) (HCC)     Past Surgical History:  Procedure Laterality Date  . ABDOMINAL HYSTERECTOMY    . CARDIAC ELECTROPHYSIOLOGY STUDY AND ABLATION    . KNEE CARTILAGE SURGERY    . LITHOTRIPSY      Current Medications: Current  Meds  Medication Sig  . meclizine (ANTIVERT) 25 MG tablet Take 1 tablet (25 mg total) by mouth 3 (three) times daily as needed for dizziness.  . metoprolol tartrate (LOPRESSOR) 25 MG tablet Take 25 mg by mouth daily.    Allergies:   Methylprednisolone and Mucinex [guaifenesin er]   Social History   Socioeconomic History  . Marital status: Married    Spouse name: Not on file  . Number of children: Not on file  . Years of education: Not on file  . Highest education level: Not on file  Occupational History  . Not on file  Tobacco Use  . Smoking status: Never Smoker  . Smokeless tobacco: Never Used  Substance and Sexual Activity  . Alcohol use: Never  . Drug use: Never  . Sexual activity: Not on file  Other Topics Concern  . Not on file  Social History Narrative  . Not on file   Social Determinants of Health   Financial Resource Strain: Not on file  Food Insecurity: Not on file  Transportation Needs: Not on file  Physical Activity: Not on file  Stress: Not on file  Social Connections: Not on file     Family History:  The patient's family history includes CAD in her brother and father; Diabetes in her brother and brother; Non-Hodgkin's  lymphoma in her father.   ROS:   Please see the history of present illness.    ROS All other systems reviewed and are negative.  No flowsheet data found.     PHYSICAL EXAM:   VS:  BP 102/60   Pulse 70   Ht 5\' 6"  (1.676 m)   Wt 175 lb 6.4 oz (79.6 kg)   SpO2 97%   BMI 28.31 kg/m    GEN: Well nourished, well developed, in no acute distress  HEENT: normal  Neck: no JVD, carotid bruits, or masses Cardiac: RRR; no murmurs, rubs, or gallops,no edema.  Intact distal pulses bilaterally.  Respiratory:  clear to auscultation bilaterally, normal work of breathing GI: soft, nontender, nondistended, + BS MS: no deformity or atrophy  Skin: warm and dry, no rash Neuro:  Alert and Oriented x 3, Strength and sensation are intact Psych:  euthymic mood, full affect  Wt Readings from Last 3 Encounters:  12/24/20 175 lb 6.4 oz (79.6 kg)  12/06/20 178 lb 5 oz (80.9 kg)  11/23/20 175 lb 6.4 oz (79.6 kg)      Studies/Labs Reviewed:   EKG:  EKG is not ordered today.  The ekg ordered by PCP on 11/23/2020 was personally reviewed today and interpreted separately by me and showed NSR with septal infarct  Recent Labs: 11/23/2020: BUN 16; Creatinine, Ser 0.77; Hemoglobin 13.7; Platelets 385; Potassium 4.6; Sodium 140   Lipid Panel No results found for: CHOL, TRIG, HDL, CHOLHDL, VLDL, LDLCALC, LDLDIRECT   Additional studies/ records that were reviewed today include:  OV notes from PCP, EKG ordered by PCP     ASSESSMENT:    1. Chest pain of uncertain etiology   2. SVT (supraventricular tachycardia) (HCC)   3. Preoperative evaluation to rule out surgical contraindication      PLAN:  In order of problems listed above:  1. Chest pain -her CRFs include post menopausal state and fm hx of premature CAD -EKG is nonischemic that I personally reviewed from 11/23/2020 that PCP ordered -I will get a Lexiscan myoview to rule out ischemia -Shared Decision Making/Informed Consent The risks [chest pain, shortness of breath, cardiac arrhythmias, dizziness, blood pressure fluctuations, myocardial infarction, stroke/transient ischemic attack, nausea, vomiting, allergic reaction, radiation exposure, metallic taste sensation and life-threatening complications (estimated to be 1 in 10,000)], benefits (risk stratification, diagnosing coronary artery disease, treatment guidance) and alternatives of a nuclear stress test were discussed in detail with Ms. Jodi Mcgrath and she agrees to proceed.  2.  SVT -she has not had any further episodes of palpitations -she currently is taking Lopressor short acting once daily for suppression of SVT and therefore only has 12 hours of drug coverage>>she tried Toprol in the past and caused severe dizziness and she  would prefer not to try it again  3.  Preoperative surgical clearance -she is having exertional CP walking up hills  -she has had a remote hx of cardiac cath in 2014 in [place] that was normal but was not have CP at that time -I have recommended Lexiscan myoview prior to her surgery -If lexiscan myoview is normal then she is low risk for surgery with a 0.4% periop risk of major cardiac event  Time Spent:  25 minutes total time of encounter, including 15 minutes spent in face-to-face patient care on the date of this encounter. This time includes coordination of care and counseling regarding above mentioned problem list. Remainder of non-face-to-face time involved reviewing chart documents/testing relevant to the patient encounter  and documentation in the medical record. I have independently reviewed documentation from referring provider  Medication Adjustments/Labs and Tests Ordered: Current medicines are reviewed at length with the patient today.  Concerns regarding medicines are outlined above.  Medication changes, Labs and Tests ordered today are listed in the Patient Instructions below.  There are no Patient Instructions on file for this visit.   Signed, Armanda Magic, Mcgrath  12/24/2020 4:31 PM    [facility] [address] [place], [place], [place]  [address] Phone: [phone]; Fax: [phone]

## 2020-12-24 NOTE — Patient Instructions (Signed)
Medication Instructions:  Your physician recommends that you continue on your current medications as directed. Please refer to the Current Medication list given to you today.  *If you need a refill on your cardiac medications before your next appointment, please call your pharmacy*  Testing/Procedures: Your physician has requested that you have a lexiscan myoview. For further information please visit [email or web address]. Please follow instruction sheet, as given.  Follow-Up: At [facility], you and your health needs are our priority.  As part of our continuing mission to provide you with exceptional heart care, we have created designated Provider Care Teams.  These Care Teams include your primary Cardiologist (physician) and Advanced Practice Providers (APPs -  Physician Assistants and Nurse Practitioners) who all work together to provide you with the care you need, when you need it.  Follow up with Dr. Turner as needed based on results of testing.    

## 2020-12-24 NOTE — Addendum Note (Signed)
Addended by: Quintella Reichert on: 12/24/2020 04:46 PM   Modules accepted: Orders

## 2020-12-25 ENCOUNTER — Telehealth: Payer: Self-pay | Admitting: *Deleted

## 2020-12-25 NOTE — Telephone Encounter (Signed)
   [facility] Pre-operative Risk Assessment    Patient Name: Jodi Mcgrath  DOB: 12/12/1955  MRN: [id number]   HEARTCARE STAFF: - Please ensure there is not already an duplicate clearance open for this procedure. - Under Visit Info/Reason for Call, type in Other and utilize the format Clearance MM/DD/YY or Clearance TBD. Do not use dashes or single digits. - If request is for dental extraction, please clarify the # of teeth to be extracted. - If the patient is currently at the dentist's office, call Pre-Op APP to address. If the patient is not currently in the dentist office, please route to the Pre-Op pool  Request for surgical clearance:  1. What type of surgery is being performed?  RIGHT TOTAL KNEE ARTHROPLASTY   2. When is this surgery scheduled?  TBD   3. What type of clearance is required (medical clearance vs. Pharmacy clearance to hold med vs. Both)?  MEDICAL  4. Are there any medications that need to be held prior to surgery and how long? N/A   5. Practice name and name of physician performing surgery?  [facility] / DR. XU  6. What is the office phone number?  [phone]   7.   What is the office fax number?  [phone]  8.   Anesthesia type (None, local, MAC, general) ?  SPINAL   Jeanann Lewandowsky 12/25/2020, 3:51 PM  _________________________________________________________________   (provider comments below)

## 2020-12-26 ENCOUNTER — Ambulatory Visit (INDEPENDENT_AMBULATORY_CARE_PROVIDER_SITE_OTHER): Payer: Medicare Other | Admitting: Family Medicine

## 2020-12-26 ENCOUNTER — Other Ambulatory Visit: Payer: Self-pay

## 2020-12-26 VITALS — BP 146/74 | Ht 67.0 in | Wt 175.0 lb

## 2020-12-26 DIAGNOSIS — M1711 Unilateral primary osteoarthritis, right knee: Secondary | ICD-10-CM

## 2020-12-26 NOTE — Patient Instructions (Signed)
Lifestyle  Lose weight if you are overweight. More weight puts more stress on your joints.  Control your blood sugar. Diabetes may increase your risk of osteoarthritis. Have your blood sugar checked to make sure you are not at risk of developing diabetes. If you already have diabetes, work closely with your health care provider to keep your blood sugar at a healthy level.  Do not wear high heels. These put stress on your toes, knees, and hips.  Do not use any products that contain nicotine or tobacco, such as cigarettes, e-cigarettes, and chewing tobacco. If you need help quitting, ask your health care provider.  Tumeric and Glucosamine supplements are the best Nutrition  Eat a healthy diet that includes foods that are high in vitamin C, vitamin D, and omega-3 fatty acids. ? Get vitamin C from fruits and vegetables. ? Get vitamin D from fish, eggs, and dairy products. Some foods have vitamin D added to them (are fortified). Look for fortified foods like milk, orange juice, and cereals. Spending some time in the sun also increases vitamin D. ? Get omega-3 from cold-water fish, such as cod, mackerel, and sardines. Other sources include:  Fortified eggs.  Soybeans.  Nuts, such as walnuts.  Flax, chia, and hemp seeds.  Check with your health care provider before taking an over-the-counter supplement for bone or joint health, especially if you are taking any medicines. Supplements may interact with some medicines. Also, evidence that taking a supplement will reduce your risk of osteoarthritis is not strong.   Where to find more information  Arthritis Foundation: www.arthritis.org  [employer] of Rheumatology: www.rheumatology.[facility] of Arthritis and Musculoskeletal and Skin Diseases: www.niams.http://www.myers.net/ Contact a health care provider if:  You have pain or swelling in a joint.  You have joint stiffness or you lose full motion at the joint.  Your joint becomes  large and knobby.  You have cracking or grinding in a joint.  You have a joint injury. Summary  Osteoarthritis involves the loss of tissue that covers the ends of bones in joints (cartilage). It can affect any joint and can make movement painful.  Osteoarthritis is most common in middle and older age.  Osteoarthritis cannot always be prevented, but you can take steps to lower your risk of developing this condition.  Eating a healthy diet, losing weight, and staying active may lower your risk of osteoarthritis.  Let your health care provider know if you have a joint injury or joint pain. This information is not intended to replace advice given to you by your health care provider. Make sure you discuss any questions you have with your health care provider. Document Revised: 01/17/2019 Document Reviewed: 10/12/2018 Elsevier Patient Education  2021 [employer].

## 2020-12-26 NOTE — Progress Notes (Signed)
    SUBJECTIVE:   CHIEF COMPLAINT / HPI:   Right Knee Pain Mrs. Jodi Mcgrath is a very pleasant 65y/o female who is well know to me. She is coming in today for advice on whether or not to proceed with elective total knee replacement. She did see Dr. Roda Shutters and is scheduled for surgery on the 27th of this month. However, she has been using a stationary bike more, doing water aerobics and walking in a pool, changing her diet, and has lost weight. After making these changes she has noticed her pain is better and now she is not sure whether or not she still needs the surgery.   We discussed the main reasons to have   PERTINENT  PMH / PSH: OA of right knee, Osteoporosis  OBJECTIVE:   BP (!) 146/74   Ht 5\' 7"  (1.702 m)   Wt 175 lb (79.4 kg)   BMI 27.41 kg/m   [facility] 06/27/2020 08/01/2020  Frequency of aerobic exercise (# of days/week) 0 4  Average time in minutes 0 30  Frequency of strengthening activities (# of days/week) 0 2   MSK: Right knee with medial side scar from previous injury. No erythema, no obvious effusion.  ASSESSMENT/PLAN:   Osteoarthritis of right knee Discussed with patient her current pain level and her activity. We discussed her current quality of life and she is not sure if she wants to proceed with TKA of the right knee since she is having some improvement. I advised her to talk with Dr. 09/29/2020 to get his opinion as well. Encouraged her to continue with weight loss and exercise and advised her to take Tumeric and Glucosamine. She will follow up with Roda Shutters as needed.     [place], DO PGY-4, Sports Medicine Fellow [facility]   I was the preceptor for this visit and available for immediate consultation [facility], DO

## 2020-12-26 NOTE — Assessment & Plan Note (Signed)
Discussed with patient her current pain level and her activity. We discussed her current quality of life and she is not sure if she wants to proceed with TKA of the right knee since she is having some improvement. I advised her to talk with Dr. Roda Shutters to get his opinion as well. Encouraged her to continue with weight loss and exercise and advised her to take Tumeric and Glucosamine. She will follow up with [place] as needed.

## 2020-12-28 ENCOUNTER — Other Ambulatory Visit: Payer: Self-pay | Admitting: Physician Assistant

## 2021-01-01 ENCOUNTER — Telehealth: Payer: Self-pay | Admitting: Cardiology

## 2021-01-01 ENCOUNTER — Telehealth (HOSPITAL_COMMUNITY): Payer: Self-pay | Admitting: *Deleted

## 2021-01-01 NOTE — Telephone Encounter (Signed)
    Pt said she is returning Jodi Mcgrath's call yesterday. She said she called her after 5 pm, no notes on file. She said she will not be available between 2-4 pm today to call her before 2 pm or after 4 pm

## 2021-01-01 NOTE — Telephone Encounter (Signed)
Patient given detailed instructions per Myocardial Perfusion Study Information Sheet for the test on 01/07/21 at 7:45. Patient notified to arrive 15 minutes early and that it is imperative to arrive on time for appointment to keep from having the test rescheduled.  If you need to cancel or reschedule your appointment, please call the office within 24 hours of your appointment. . Patient verbalized understanding.Jodi Mcgrath

## 2021-01-01 NOTE — Telephone Encounter (Signed)
Spoke with the patient who states that she is unsure about why she needs to have such an extensive stress test done. I explained to her that due to her history and report of chest tightness it is indicated for her to have a lexiscan myoview to rule out ischemia and for pre-operative clearance. Patient verbalize understanding however is still hesitant. She states that she is extremely claustrophobic. I explained the testing and imaging to her and she states that she does not think that she would be able to do it without some medication to ease her. She would like to know if there is another less extensive test that she can do instead or if Dr. Mayford Knife would be able to prescribe her something for the lexiscan myoview

## 2021-01-01 NOTE — Telephone Encounter (Signed)
Spoke with the patient and advised her that she can take Valium 5 mg upon arrival for stress test. She states that her husband will be coming with her to drive her home.

## 2021-01-02 ENCOUNTER — Ambulatory Visit: Payer: Medicare Other | Admitting: Cardiology

## 2021-01-04 ENCOUNTER — Other Ambulatory Visit: Payer: Self-pay

## 2021-01-07 ENCOUNTER — Encounter (HOSPITAL_COMMUNITY): Payer: Self-pay | Admitting: Cardiology

## 2021-01-07 ENCOUNTER — Encounter (HOSPITAL_COMMUNITY): Payer: Self-pay

## 2021-01-07 ENCOUNTER — Encounter (HOSPITAL_COMMUNITY): Payer: Medicare Other

## 2021-01-07 NOTE — Telephone Encounter (Signed)
Pt no showed for her Myoview today. Pt will need to be rescheduled. Elvera Lennox for Myoview aware pt needs to be rescheduled. I will also send notes as FYI to surgeon's office to keep updated.

## 2021-01-07 NOTE — Telephone Encounter (Signed)
Attempted to reach patient to let her know that she needs to have stress test done before clearance can determined. Sent to scheduling for rescheduling.

## 2021-01-07 NOTE — Telephone Encounter (Signed)
Dr. Mayford Knife ordered Myoview for pre-op evaluation given reports of exertional chest pain at last visit. Patient no showed Myoview this morning. Pre-op covering staff, can you please help get this rescheduled and let patient know that we cannot clear her for surgery until she has this test done?  Thank you so much! Ruhee Enck

## 2021-01-07 NOTE — Telephone Encounter (Signed)
Message sent to [employer]. To schedule Myoview. Order previously has been placed

## 2021-01-08 ENCOUNTER — Ambulatory Visit: Payer: Medicare Other | Admitting: Endocrinology

## 2021-01-08 NOTE — Telephone Encounter (Signed)
Myoview has been rescheduled for 01/28/21 needed for pre op clearance. Will send FYI to surgeon's office pt has rescheduled her Myoview for 01/28/21.

## 2021-01-09 NOTE — Telephone Encounter (Signed)
I will send another update to surgeon's office that pt is scheduled for her stress test 01/28/21 for pre op clearance. Pt no showed for stress test on 01/07/21. Our office received another referral for appt from surgeon's office. Pt saw Dr. Mayford Knife 12/24/20 as NEW PT and is needing cardiac testing before she can be cleared.

## 2021-01-10 ENCOUNTER — Encounter: Payer: Self-pay | Admitting: Family Medicine

## 2021-01-11 ENCOUNTER — Other Ambulatory Visit: Payer: Self-pay | Admitting: Family Medicine

## 2021-01-11 DIAGNOSIS — R42 Dizziness and giddiness: Secondary | ICD-10-CM

## 2021-01-11 DIAGNOSIS — H9312 Tinnitus, left ear: Secondary | ICD-10-CM

## 2021-01-11 MED ORDER — MECLIZINE HCL 25 MG PO TABS: 25.0000 mg | ORAL_TABLET | Freq: Three times a day (TID) | ORAL | 2 refills | Status: DC | PRN

## 2021-01-11 NOTE — Progress Notes (Signed)
Meclizine refilled per pt request.

## 2021-01-22 ENCOUNTER — Telehealth (HOSPITAL_COMMUNITY): Payer: Self-pay | Admitting: *Deleted

## 2021-01-22 NOTE — Telephone Encounter (Signed)
Patient given detailed instructions per Myocardial Perfusion Study Information Sheet for the test on 01/28/21 at 10:00. Patient notified to arrive 15 minutes early and that it is imperative to arrive on time for appointment to keep from having the test rescheduled.  If you need to cancel or reschedule your appointment, please call the office within 24 hours of your appointment. . Patient verbalized understanding.Jodi Mcgrath

## 2021-01-27 ENCOUNTER — Encounter: Payer: Self-pay | Admitting: Family Medicine

## 2021-01-28 ENCOUNTER — Other Ambulatory Visit: Payer: Self-pay

## 2021-01-28 ENCOUNTER — Ambulatory Visit (HOSPITAL_COMMUNITY): Payer: Medicare Other | Attending: Internal Medicine

## 2021-01-28 ENCOUNTER — Other Ambulatory Visit: Payer: Self-pay | Admitting: Family Medicine

## 2021-01-28 DIAGNOSIS — I471 Supraventricular tachycardia: Secondary | ICD-10-CM

## 2021-01-28 DIAGNOSIS — R072 Precordial pain: Secondary | ICD-10-CM | POA: Diagnosis present

## 2021-01-28 LAB — MYOCARDIAL PERFUSION IMAGING
LV dias vol: 61 mL (ref 46–106)
LV sys vol: 14 mL
Peak HR: 94 {beats}/min
Rest HR: 73 {beats}/min
SDS: 0
SRS: 0
SSS: 0
TID: 0.87

## 2021-01-28 MED ORDER — TECHNETIUM TC 99M TETROFOSMIN IV KIT
11.0000 | PACK | Freq: Once | INTRAVENOUS | Status: AC | PRN
  Administered 2021-01-28: 11 via INTRAVENOUS
  Filled 2021-01-28: qty 11

## 2021-01-28 MED ORDER — TECHNETIUM TC 99M TETROFOSMIN IV KIT
31.6000 | PACK | Freq: Once | INTRAVENOUS | Status: AC | PRN
  Administered 2021-01-28: 31.6 via INTRAVENOUS
  Filled 2021-01-28: qty 32

## 2021-01-28 MED ORDER — METOPROLOL TARTRATE 25 MG PO TABS: 25.0000 mg | ORAL_TABLET | Freq: Every day | ORAL | 1 refills | Status: DC

## 2021-01-28 MED ORDER — REGADENOSON 0.4 MG/5ML IV SOLN
0.4000 mg | Freq: Once | INTRAVENOUS | Status: AC
  Administered 2021-01-28: 0.4 mg via INTRAVENOUS

## 2021-01-29 NOTE — Telephone Encounter (Signed)
I have made pat aware of recommendations per pre op clearance. Pt verbalized understanding and thanked me for the call.  I have sent a fax to the requesting surgeon's office via Epic.

## 2021-01-29 NOTE — Telephone Encounter (Signed)
   Name: Jodi Mcgrath  DOB: Dec 27, 1955  MRN: [id number]   Primary Cardiologist: Armanda Magic, MD  Chart reviewed as part of pre-operative protocol coverage.   Pt underwent nuclear stress test that was negative for ischemia. Per Dr. Mayford Knife, patient may proceed with surgery.  I will route this recommendation to the requesting party via Epic fax function and remove from pre-op pool. Please call with questions.  Roe Rutherford Laurina Fischl, PA 01/29/2021, 8:35 AM

## 2021-02-07 NOTE — Pre-Procedure Instructions (Signed)
Surgical Instructions    Your procedure is scheduled on Monday February 18, 2021.   Report to [facility] Main Entrance "A" at 05:30 A.M., then check in with the Admitting office.  Call this number if you have problems the morning of surgery:  [phone]   If you have any questions prior to your surgery date call [phone]: Open Monday-Friday 8am-4pm    Remember:  Do not eat after midnight the night before your surgery  You may drink clear liquids until 04:15 A.M. the morning of your surgery.   Clear liquids allowed are: Water, Non-Citrus Juices (without pulp), Carbonated Beverages, Clear Tea, Black Coffee Only, and Gatorade Patient Instructions  The night before surgery:  No food after midnight. ONLY clear liquids after midnight  The day of surgery (if you do NOT have diabetes):  Drink ONE (1) Pre-Surgery Clear Ensure by 04:15 A.M. the morning of surgery. Drink in one sitting. Do not sip.  This drink was given to you during your hospital  pre-op appointment visit.  Nothing else to drink after completing the  Pre-Surgery Clear Ensure.         If you have questions, please contact your surgeon's office.     Take these medicines the morning of surgery with A SIP OF WATER   diazepam (VALIUM)- If needed   ondansetron (ZOFRAN-ODT)- If needed   As of today, STOP taking any Aspirin (unless otherwise instructed by your surgeon) Aleve, Naproxen, Ibuprofen, Motrin, Advil, Goody's, BC's, all herbal medications, fish oil, and all vitamins.                     Do NOT Smoke (Tobacco/Vaping) or drink Alcohol 24 hours prior to your procedure.  If you use a CPAP at night, you may bring all equipment for your overnight stay.   Contacts, glasses, piercing's, hearing aid's, dentures or partials may not be worn into surgery, please bring cases for these belongings.    For patients admitted to the hospital, discharge time will be determined by your treatment team.   Patients discharged  the day of surgery will not be allowed to drive home, and someone needs to stay with them for 24 hours.  ONLY 1 SUPPORT PERSON MAY BE PRESENT WHILE YOU ARE IN SURGERY. IF YOU ARE TO BE ADMITTED ONCE YOU ARE IN YOUR ROOM YOU WILL BE ALLOWED TWO (2) VISITORS.  Minor children may have two parents present. Special consideration for safety and communication needs will be reviewed on a case by case basis.   Special instructions:   - Preparing For Surgery  Before surgery, you can play an important role. Because skin is not sterile, your skin needs to be as free of germs as possible. You can reduce the number of germs on your skin by washing with CHG (chlorahexidine gluconate) Soap before surgery.  CHG is an antiseptic cleaner which kills germs and bonds with the skin to continue killing germs even after washing.    Oral Hygiene is also important to reduce your risk of infection.  Remember - BRUSH YOUR TEETH THE MORNING OF SURGERY WITH YOUR REGULAR TOOTHPASTE  Please do not use if you have an allergy to CHG or antibacterial soaps. If your skin becomes reddened/irritated stop using the CHG.  Do not shave (including legs and underarms) for at least 48 hours prior to first CHG shower. It is OK to shave your face.  Please follow these instructions carefully.   Shower the [employer]  SURGERY and the MORNING OF SURGERY  If you chose to wash your hair, wash your hair first as usual with your normal shampoo.  After you shampoo, rinse your hair and body thoroughly to remove the shampoo.  Use CHG Soap as you would any other liquid soap. You can apply CHG directly to the skin and wash gently with a scrungie or a clean washcloth.   Apply the CHG Soap to your body ONLY FROM THE NECK DOWN.  Do not use on open wounds or open sores. Avoid contact with your eyes, ears, mouth and genitals (private parts). Wash Face and genitals (private parts)  with your normal soap.   Wash thoroughly, paying special  attention to the area where your surgery will be performed.  Thoroughly rinse your body with warm water from the neck down.  DO NOT shower/wash with your normal soap after using and rinsing off the CHG Soap.  Pat yourself dry with a CLEAN TOWEL.  Wear CLEAN PAJAMAS to bed the night before surgery  Place CLEAN SHEETS on your bed the night before your surgery  DO NOT SLEEP WITH PETS.   Day of Surgery: Shower with CHG soap. Do not wear jewelry, make up, nail polish, gel polish, artificial nails, or any other type of covering on natural nails including finger and toenails. If patients have artificial nails, gel coating, etc. that need to be removed by a nail salon please have this removed prior to surgery. Surgery may need to be canceled/delayed if the surgeon/ anesthesia feels like the patient is unable to be adequately monitored. Do not wear lotions, powders, perfumes/colognes, or deodorant. Do not shave 48 hours prior to surgery.  Men may shave face and neck. Do not bring valuables to the hospital. [facility] is not responsible for any belongings or valuables. Wear Clean/Comfortable clothing the morning of surgery Remember to brush your teeth WITH YOUR REGULAR TOOTHPASTE.   Please read over the following fact sheets that you were given.

## 2021-02-08 ENCOUNTER — Encounter: Payer: Self-pay | Admitting: Family Medicine

## 2021-02-08 ENCOUNTER — Other Ambulatory Visit: Payer: Self-pay

## 2021-02-08 ENCOUNTER — Encounter (HOSPITAL_COMMUNITY)
Admission: RE | Admit: 2021-02-08 | Discharge: 2021-02-08 | Disposition: A | Discharge status: Home / Self Care | Payer: Medicare Other | Source: Ambulatory Visit | Attending: Orthopaedic Surgery | Admitting: Orthopaedic Surgery

## 2021-02-08 ENCOUNTER — Ambulatory Visit (HOSPITAL_COMMUNITY)
Admission: RE | Admit: 2021-02-08 | Discharge: 2021-02-08 | Disposition: A | Discharge status: Home / Self Care | Payer: Medicare Other | Source: Ambulatory Visit | Attending: Physician Assistant | Admitting: Physician Assistant

## 2021-02-08 ENCOUNTER — Encounter (HOSPITAL_COMMUNITY): Payer: Self-pay

## 2021-02-08 DIAGNOSIS — Z01818 Encounter for other preprocedural examination: Secondary | ICD-10-CM | POA: Insufficient documentation

## 2021-02-08 DIAGNOSIS — M1711 Unilateral primary osteoarthritis, right knee: Secondary | ICD-10-CM | POA: Diagnosis present

## 2021-02-08 DIAGNOSIS — Z79899 Other long term (current) drug therapy: Secondary | ICD-10-CM | POA: Diagnosis not present

## 2021-02-08 DIAGNOSIS — I471 Supraventricular tachycardia: Secondary | ICD-10-CM | POA: Insufficient documentation

## 2021-02-08 DIAGNOSIS — I10 Essential (primary) hypertension: Secondary | ICD-10-CM | POA: Insufficient documentation

## 2021-02-08 DIAGNOSIS — R42 Dizziness and giddiness: Secondary | ICD-10-CM | POA: Insufficient documentation

## 2021-02-08 HISTORY — DX: Essential (primary) hypertension: I10

## 2021-02-08 HISTORY — DX: Fibromyalgia: M79.7

## 2021-02-08 HISTORY — DX: Personal history of urinary calculi: Z87.442

## 2021-02-08 HISTORY — DX: Anxiety disorder, unspecified: F41.9

## 2021-02-08 LAB — URINALYSIS, ROUTINE W REFLEX MICROSCOPIC
Bilirubin Urine: NEGATIVE
Glucose, UA: NEGATIVE mg/dL
Ketones, ur: NEGATIVE mg/dL
Leukocytes,Ua: NEGATIVE
Nitrite: NEGATIVE
Protein, ur: NEGATIVE mg/dL
Specific Gravity, Urine: 1.025 (ref 1.005–1.030)
pH: 6 (ref 5.0–8.0)

## 2021-02-08 LAB — COMPREHENSIVE METABOLIC PANEL
ALT: 15 U/L (ref 0–44)
AST: 18 U/L (ref 15–41)
Albumin: 4.1 g/dL (ref 3.5–5.0)
Alkaline Phosphatase: 72 U/L (ref 38–126)
Anion gap: 8 (ref 5–15)
BUN: 17 mg/dL (ref 8–23)
CO2: 23 mmol/L (ref 22–32)
Calcium: 9.2 mg/dL (ref 8.9–10.3)
Chloride: 103 mmol/L (ref 98–111)
Creatinine, Ser: 1.03 mg/dL — ABNORMAL HIGH (ref 0.44–1.00)
GFR, Estimated: 60 mL/min — ABNORMAL LOW (ref >60)
Glucose, Bld: 210 mg/dL — ABNORMAL HIGH (ref 70–99)
Potassium: 3.6 mmol/L (ref 3.5–5.1)
Sodium: 134 mmol/L — ABNORMAL LOW (ref 135–145)
Total Bilirubin: 0.7 mg/dL (ref 0.3–1.2)
Total Protein: 7 g/dL (ref 6.5–8.1)

## 2021-02-08 LAB — URINALYSIS, MICROSCOPIC (REFLEX)
Bacteria, UA: NONE SEEN
WBC, UA: NONE SEEN WBC/hpf (ref 0–5)

## 2021-02-08 LAB — SURGICAL PCR SCREEN
MRSA, PCR: NEGATIVE
Staphylococcus aureus: NEGATIVE

## 2021-02-08 LAB — CBC WITH DIFFERENTIAL/PLATELET
Abs Immature Granulocytes: 0.02 10*3/uL (ref 0.00–0.07)
Basophils Absolute: 0 10*3/uL (ref 0.0–0.1)
Basophils Relative: 0 %
Eosinophils Absolute: 0.1 10*3/uL (ref 0.0–0.5)
Eosinophils Relative: 1 %
HCT: 44.1 % (ref 36.0–46.0)
Hemoglobin: 14.9 g/dL (ref 12.0–15.0)
Immature Granulocytes: 0 %
Lymphocytes Relative: 39 %
Lymphs Abs: 3.5 10*3/uL (ref 0.7–4.0)
MCH: 28.4 pg (ref 26.0–34.0)
MCHC: 33.8 g/dL (ref 30.0–36.0)
MCV: 84.2 fL (ref 80.0–100.0)
Monocytes Absolute: 0.6 10*3/uL (ref 0.1–1.0)
Monocytes Relative: 7 %
Neutro Abs: 4.8 10*3/uL (ref 1.7–7.7)
Neutrophils Relative %: 53 %
Platelets: 400 10*3/uL (ref 150–400)
RBC: 5.24 MIL/uL — ABNORMAL HIGH (ref 3.87–5.11)
RDW: 13 % (ref 11.5–15.5)
WBC: 9 10*3/uL (ref 4.0–10.5)
nRBC: 0 % (ref 0.0–0.2)

## 2021-02-08 LAB — APTT: aPTT: 28 seconds (ref 24–36)

## 2021-02-08 LAB — TYPE AND SCREEN
ABO/RH(D): O NEG
Antibody Screen: NEGATIVE

## 2021-02-08 LAB — PROTIME-INR
INR: 1 (ref 0.8–1.2)
Prothrombin Time: 12.8 seconds (ref 11.4–15.2)

## 2021-02-08 NOTE — Progress Notes (Addendum)
PCP - Dr. Burley Saver Cardiologist - Dr. Armanda Magic  PPM/ICD - n/a Device Orders - n/a Rep Notified - n/a  Chest x-ray - 02/08/21 EKG - 11/23/20 Stress Test - 01/28/21 ECHO - per patient in 2014 in [place] Cardiac Cath - denies  Sleep Study - n/a CPAP - denies  Fasting Blood Sugar - n/a Checks Blood Sugar __ n/a __ times a day  Blood Thinner Instructions: n/a Aspirin Instructions: na  ERAS Protcol - Clear liquids until 0415 am day of surgery PRE-SURGERY Ensure or G2- Ensure  COVID TEST- Scheduled for 02/14/21. Patient is aware of date, time, and location   Anesthesia review: Yes. Cardiac History Patient is not diabetic. Glucose on CMP resulted 210. Called patient and she stated she ate some animal crackers and a plain bagel prior to PAT appointment.   Patient denies shortness of breath, fever, cough and chest pain at PAT appointment   All instructions explained to the patient, with a verbal understanding of the material. Patient agrees to go over the instructions while at home for a better understanding. Patient also instructed to self quarantine after being tested for COVID-19. The opportunity to ask questions was provided.

## 2021-02-11 NOTE — Progress Notes (Addendum)
Anesthesia Chart Review:  Case: [id number] Date/Time: 02/18/21 0700   Procedure: RIGHT TOTAL KNEE ARTHROPLASTY (Right: Knee)   Anesthesia type: Spinal   Pre-op diagnosis: right knee degenerative joint disease   Location: [facility] ROOM 04 / [facility] OR   Surgeons: Tarry Kos, MD       DISCUSSION: Patient is a 65 year old female scheduled for the above procedure.  History includes never smoker, HTN, SVT (s/p ablation in [place] sometime between ~ 2009-2014), Lyme disease (~ 2006), depression, sensorineural hearing loss, vertigo, fibromyalgia, anxiety.  PCP Dr. Miquel Dunn saw patient on 11/23/2020 for preoperative valuation.  Cardiology clearance recommended given reports of chest pain. Patient evaluated by cardiologist Dr. Mayford Knife on 12/24/2020 for chest pain and preoperative clearance.  Nuclear stress test was ordered which was nonischemic.  Preoperative cardiology input outlined on 01/29/2021 by Micah Flesher, PA: "Pt underwent nuclear stress test that was negative for ischemia. Per Dr. Mayford Knife, patient may proceed with surgery."  Preoperative labs on 02/08/21 showed a non-fasting glucose of 210 (ate animal crackers and a plain bagel prior to PAT visit). It was only 80 with labs on 11/23/20. There is not reported history of DM. It is too late to add an A1c onto 02/08/21 labs. I will notify Dr. Roda Shutters of results and will defer to him if he wants a preoperative A1c, otherwise can check a CBG on the morning of surgery. She is a first case.   Preoperative COVID-19 test is scheduled for 02/14/21.   ADDENDUM 02/14/21 9:20 AM:  Patient had an A1c drawn on 02/13/21 per Dr. Roda Shutters. Results were normal at 5.6%. Given normal results, I do not think she will require a CBG on arrival.    VS: BP 130/71   Pulse 91   Temp 36.7 C (Oral)   Resp 18   Ht 5\' 6"  (1.676 m)   Wt 79.6 kg   SpO2 99%   BMI 28.31 kg/m    PROVIDERS: , MD is PCP [facility]) [facility], MD is cardiologist  Armanda Magic, MD  is neurologist. Evaluated 12/06/20 for second opinion regarding recurrent vertigo. 12/08/20, MD is ENT. Televist 01/22/21 to discuss recurrent vertigo given upcoming TKA.  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  Valium at onset of vertigo recommended,  and she was referred to [facility] for vestibular testing.   LABS: Preoperative labs noted. See DISCUSSION. (all labs ordered are listed, but only abnormal results are displayed)  Labs Reviewed  CBC WITH DIFFERENTIAL/PLATELET - Abnormal; Notable for the following components:      Result Value   RBC 5.24 (*)    All other components within normal limits  COMPREHENSIVE METABOLIC PANEL - Abnormal; Notable for the following components:   Sodium 134 (*)    Glucose, Bld 210 (*)    Creatinine, Ser 1.03 (*)    GFR, Estimated 60 (*)    All other components within normal limits  URINALYSIS, ROUTINE W REFLEX MICROSCOPIC - Abnormal; Notable for the following components:   Hgb urine dipstick SMALL (*)    All other components within normal limits  SURGICAL PCR SCREEN  PROTIME-INR  APTT  URINALYSIS, MICROSCOPIC (REFLEX)  TYPE AND SCREEN    IMAGES: CXR 02/08/21: FINDINGS: Normal heart size, mediastinal contours, and pulmonary vascularity. Mild bronchitic changes. No pulmonary infiltrate, pleural effusion, or pneumothorax. Mild osseous demineralization. IMPRESSION: Bronchitic changes without acute infiltrate.  MRI Brain 11/14/20 ([facility]): IMPRESSION:  1. No acute intracranial abnormality or abnormal contrast  enhancement.  2. A few scattered foci of T2 hyperintensity within the white matter  of the cerebral hemispheres, nonspecific but may represent mild  chronic microvascular ischemic changes.  3. Small fluid level within the left sphenoid sinus. Correlate for  acute sinusitis.    EKG: 11/23/20:  Sinus rhythm with 1st degree A-V block Possible Left atrial enlargement Septal infarct , age undetermined No old tracing to  compare Confirmed by Dietrich Pates ([id number]) on 11/23/2020 10:37:18 PM   CV: Nuclear stress test 01/28/21:  The left ventricular ejection fraction is hyperdynamic (>65%). Nuclear stress EF: 78%. There was no ST segment deviation noted during stress. No T wave inversion was noted during stress. The study is normal. This is a low risk study.   IMPRESSIONS Negative for stress induced arrhythmias. Hyperdynamic LV function. No evidence of ischemia or infarction.   RECOMMENDATIONS/CONCLUSIONS Stress test is negative. The study is consistent with a low risk study.   Per Dr. Gloris Manchester Turner's 12/25/19 office note, "She has had a remote cardiac cath in [place] in 2014 that was normal.  The cath was done at that time due to SVT and had not had any CP at that time."  Patient thought she also had echocardiogram in 2014 in [place].  Past Medical History:  Diagnosis Date   Anxiety    Depression    Fibromyalgia    H/o Lyme disease    History of kidney stones    Hypertension    Osteoarthritis    Osteoarthritis of right knee 06/27/2020   Osteoporosis    Overweight (BMI 25.0-29.9) 07/26/2019   Preoperative evaluation to rule out surgical contraindication 11/23/2020   Sensorineural hearing loss 10/02/2020   SVT (supraventricular tachycardia) (HCC)     Past Surgical History:  Procedure Laterality Date   ABDOMINAL HYSTERECTOMY     CARDIAC ELECTROPHYSIOLOGY STUDY AND ABLATION     DILATION AND CURETTAGE, DIAGNOSTIC / THERAPEUTIC     KNEE CARTILAGE SURGERY     LITHOTRIPSY      MEDICATIONS:  diazepam (VALIUM) 2 MG tablet   meclizine (ANTIVERT) 25 MG tablet   metoprolol tartrate (LOPRESSOR) 25 MG tablet   ondansetron (ZOFRAN-ODT) 4 MG disintegrating tablet   No current facility-administered medications for this encounter.    Shonna Chock, PA-C Surgical Short Stay/Anesthesiology [facility] Phone [phone] [facility] Phone [phone] 02/11/2021 6:12 PM

## 2021-02-12 ENCOUNTER — Other Ambulatory Visit: Payer: Self-pay | Admitting: Physician Assistant

## 2021-02-12 MED ORDER — OXYCODONE-ACETAMINOPHEN 5-325 MG PO TABS: 1.0000 | ORAL_TABLET | Freq: Four times a day (QID) | ORAL | 0 refills | Status: DC | PRN

## 2021-02-12 MED ORDER — ASPIRIN EC 81 MG PO TBEC: 81.0000 mg | DELAYED_RELEASE_TABLET | Freq: Two times a day (BID) | ORAL | 0 refills | Status: DC

## 2021-02-12 MED ORDER — METHOCARBAMOL 500 MG PO TABS: 500.0000 mg | ORAL_TABLET | Freq: Two times a day (BID) | ORAL | 0 refills | Status: DC | PRN

## 2021-02-12 MED ORDER — DOCUSATE SODIUM 100 MG PO CAPS: 100.0000 mg | ORAL_CAPSULE | Freq: Every day | ORAL | 2 refills | Status: DC | PRN

## 2021-02-12 MED ORDER — ONDANSETRON HCL 4 MG PO TABS: 4.0000 mg | ORAL_TABLET | Freq: Three times a day (TID) | ORAL | 0 refills | Status: DC | PRN

## 2021-02-13 ENCOUNTER — Ambulatory Visit: Payer: Medicare Other

## 2021-02-14 ENCOUNTER — Other Ambulatory Visit (HOSPITAL_COMMUNITY)
Admission: RE | Admit: 2021-02-14 | Discharge: 2021-02-14 | Disposition: A | Discharge status: Home / Self Care | Payer: Medicare Other | Source: Ambulatory Visit | Attending: Orthopaedic Surgery | Admitting: Orthopaedic Surgery

## 2021-02-14 DIAGNOSIS — Z01812 Encounter for preprocedural laboratory examination: Secondary | ICD-10-CM | POA: Insufficient documentation

## 2021-02-14 DIAGNOSIS — Z20822 Contact with and (suspected) exposure to covid-19: Secondary | ICD-10-CM | POA: Diagnosis not present

## 2021-02-14 LAB — SARS CORONAVIRUS 2 (TAT 6-24 HRS): SARS Coronavirus 2: NEGATIVE

## 2021-02-14 LAB — HEMOGLOBIN A1C
Hgb A1c MFr Bld: 5.6 % of total Hgb (ref <5.7)
Mean Plasma Glucose: 114 mg/dL
eAG (mmol/L): 6.3 mmol/L

## 2021-02-14 NOTE — Anesthesia Preprocedure Evaluation (Addendum)
Anesthesia Evaluation  Patient identified by MRN, date of birth, ID band Patient awake    Reviewed: Allergy & Precautions, H&P , NPO status , Patient's Chart, lab work & pertinent test results  Airway Mallampati: III  TM Distance: >3 FB Neck ROM: Full    Dental no notable dental hx. (+) Teeth Intact, Dental Advisory Given   Pulmonary neg pulmonary ROS,    Pulmonary exam normal breath sounds clear to auscultation       Cardiovascular Exercise Tolerance: Good hypertension, Pt. on medications and Pt. on home beta blockers Normal cardiovascular exam+ dysrhythmias Supra Ventricular Tachycardia  Rhythm:Regular Rate:Normal  EKG: 11/23/20: Sinus rhythm with 1st degree A-V block Possible Left atrial enlargement Septal infarct , age undetermined No old tracing to compare Confirmed by Dorris Carnes [phone]) on 11/23/2020 10:37:18 PM   CV: Nuclear stress test 01/28/21:  The left ventricular ejection fraction is hyperdynamic (>65%). Nuclear stress EF: 78%. There was no ST segment deviation noted during stress. No T wave inversion was noted during stress. The study is normal. This is a low risk study.  IMPRESSIONS Negative for stress induced arrhythmias. Hyperdynamic LV function. No evidence of ischemia or infarction.   Neuro/Psych PSYCHIATRIC DISORDERS Anxiety Depression  Neuromuscular disease    GI/Hepatic negative GI ROS, Neg liver ROS,   Endo/Other  Morbid obesity  Renal/GU negative Renal ROS  negative genitourinary   Musculoskeletal  (+) Arthritis , Fibromyalgia -  Abdominal   Peds negative pediatric ROS (+)  Hematology negative hematology ROS (+)   Anesthesia Other Findings   Reproductive/Obstetrics negative OB ROS                           Anesthesia Physical Anesthesia Plan  ASA: 3  Anesthesia Plan: MAC and Spinal   Post-op Pain Management:  Regional for Post-op pain   Induction:  Intravenous  PONV Risk Score and Plan: 3 and Ondansetron, Dexamethasone and Propofol infusion  Airway Management Planned: Natural Airway, Nasal Cannula and Simple Face Mask  Additional Equipment: None  Intra-op Plan:   Post-operative Plan:   Informed Consent: I have reviewed the patients History and Physical, chart, labs and discussed the procedure including the risks, benefits and alternatives for the proposed anesthesia with the patient or authorized representative who has indicated his/her understanding and acceptance.       Plan Discussed with: Anesthesiologist  Anesthesia Plan Comments: (PAT note written by Myra Gianotti, PA-C.  Patient is a 65 year old female scheduled for the above procedure.  History includes never smoker, HTN, SVT (s/p ablation in [place] sometime between ~ 2009-2014), Lyme disease (~ 2006), depression, sensorineural hearing loss, vertigo, fibromyalgia, anxiety.  PCP Dr. Thompson Grayer saw patient on 11/23/2020 for preoperative valuation.  Cardiology clearance recommended given reports of chest pain. Patient evaluated by cardiologist Dr. Radford Pax on 12/24/2020 for chest pain and preoperative clearance.  Nuclear stress test was ordered which was nonischemic.  Preoperative cardiology input outlined on 01/29/2021 by Fabian Sharp, PA: "Pt underwent nuclear stress test that was negative for ischemia. Per Dr. Radford Pax, patient may proceed with surgery."  Preoperative labs on 02/08/21 showed a non-fasting glucose of 210 (ate animal crackers and a plain bagel prior to PAT visit). It was only 64 with labs on 11/23/20. There is not reported history of DM. It is too late to add an A1c onto 02/08/21 labs. I will notify Dr. Erlinda Hong of results and will defer to him if he wants a preoperative A1c, otherwise  can check a CBG on the morning of surgery. She is a first case.)      Anesthesia Quick Evaluation

## 2021-02-17 ENCOUNTER — Telehealth: Payer: Self-pay | Admitting: *Deleted

## 2021-02-17 NOTE — Telephone Encounter (Signed)
Ortho bundle pre-op call completed. 

## 2021-02-17 NOTE — Care Plan (Signed)
RNCM call to patient to discuss her Right total knee arthroplasty with Dr. Roda Shutters. She is an Ortho bundle patient through [facility] and is agreeable to case management. She lives with her husband, who be assisting at home after discharge. She was provided a CPM, FWW, and 3in1 by [employer] already. She needs no other DME. Anticipate HHPT will be needed after short hospital stay. Referral made to [facility] after choice provided. Reviewed all post op care instructions. Will continue to follow for needs.

## 2021-02-18 ENCOUNTER — Observation Stay (HOSPITAL_COMMUNITY): Payer: Medicare Other

## 2021-02-18 ENCOUNTER — Ambulatory Visit (HOSPITAL_COMMUNITY): Payer: Medicare Other | Admitting: Vascular Surgery

## 2021-02-18 ENCOUNTER — Observation Stay (HOSPITAL_COMMUNITY)
Admission: RE | Admit: 2021-02-18 | Discharge: 2021-02-19 | Disposition: A | Discharge status: Home / Self Care | Payer: Medicare Other | Attending: Orthopaedic Surgery | Admitting: Orthopaedic Surgery

## 2021-02-18 ENCOUNTER — Encounter (HOSPITAL_COMMUNITY)
Admission: RE | Disposition: A | Discharge status: Home / Self Care | Payer: Self-pay | Source: Home / Self Care | Attending: Orthopaedic Surgery

## 2021-02-18 DIAGNOSIS — Z7982 Long term (current) use of aspirin: Secondary | ICD-10-CM | POA: Diagnosis not present

## 2021-02-18 DIAGNOSIS — I10 Essential (primary) hypertension: Secondary | ICD-10-CM | POA: Diagnosis not present

## 2021-02-18 DIAGNOSIS — Z79899 Other long term (current) drug therapy: Secondary | ICD-10-CM | POA: Insufficient documentation

## 2021-02-18 DIAGNOSIS — M1711 Unilateral primary osteoarthritis, right knee: Secondary | ICD-10-CM | POA: Diagnosis present

## 2021-02-18 DIAGNOSIS — Z96651 Presence of right artificial knee joint: Secondary | ICD-10-CM

## 2021-02-18 DIAGNOSIS — R52 Pain, unspecified: Secondary | ICD-10-CM

## 2021-02-18 HISTORY — PX: TOTAL KNEE ARTHROPLASTY: SHX125

## 2021-02-18 LAB — ABO/RH: ABO/RH(D): O NEG

## 2021-02-18 SURGERY — ARTHROPLASTY, KNEE, TOTAL
Anesthesia: Monitor Anesthesia Care | Site: Knee | Laterality: Right

## 2021-02-18 MED ORDER — CHLORHEXIDINE GLUCONATE 0.12 % MT SOLN
OROMUCOSAL | Status: AC
  Administered 2021-02-18: 15 mL
  Filled 2021-02-18: qty 15

## 2021-02-18 MED ORDER — ONDANSETRON HCL 4 MG/2ML IJ SOLN
4.0000 mg | Freq: Four times a day (QID) | INTRAMUSCULAR | Status: DC | PRN
  Administered 2021-02-18 – 2021-02-19 (×3): 4 mg via INTRAVENOUS
  Filled 2021-02-18 (×3): qty 2

## 2021-02-18 MED ORDER — PROPOFOL 500 MG/50ML IV EMUL
INTRAVENOUS | Status: DC | PRN
  Administered 2021-02-18: 75 ug/kg/min via INTRAVENOUS

## 2021-02-18 MED ORDER — ONDANSETRON HCL 4 MG/2ML IJ SOLN: 4.0000 mg | Freq: Once | INTRAMUSCULAR | Status: DC | PRN

## 2021-02-18 MED ORDER — BUPIVACAINE-MELOXICAM ER 200-6 MG/7ML IJ SOLN
INTRAMUSCULAR | Status: AC
  Filled 2021-02-18: qty 1

## 2021-02-18 MED ORDER — METOPROLOL TARTRATE 25 MG PO TABS
25.0000 mg | ORAL_TABLET | Freq: Every day | ORAL | Status: DC
  Administered 2021-02-19: 25 mg via ORAL
  Filled 2021-02-18: qty 1

## 2021-02-18 MED ORDER — TRANEXAMIC ACID-NACL 1000-0.7 MG/100ML-% IV SOLN
1000.0000 mg | INTRAVENOUS | Status: AC
  Administered 2021-02-18: 1000 mg via INTRAVENOUS
  Filled 2021-02-18: qty 100

## 2021-02-18 MED ORDER — DOCUSATE SODIUM 100 MG PO CAPS
100.0000 mg | ORAL_CAPSULE | Freq: Two times a day (BID) | ORAL | Status: DC
  Administered 2021-02-18 (×2): 100 mg via ORAL
  Filled 2021-02-18 (×3): qty 1

## 2021-02-18 MED ORDER — VANCOMYCIN HCL 1000 MG IV SOLR
INTRAVENOUS | Status: AC
  Filled 2021-02-18: qty 1000

## 2021-02-18 MED ORDER — DEXAMETHASONE SODIUM PHOSPHATE 10 MG/ML IJ SOLN
INTRAMUSCULAR | Status: DC | PRN
  Administered 2021-02-18 (×2): 10 mg

## 2021-02-18 MED ORDER — TRANEXAMIC ACID-NACL 1000-0.7 MG/100ML-% IV SOLN
1000.0000 mg | Freq: Once | INTRAVENOUS | Status: AC
  Administered 2021-02-18: 1000 mg via INTRAVENOUS
  Filled 2021-02-18: qty 100

## 2021-02-18 MED ORDER — POVIDONE-IODINE 10 % EX SWAB
2.0000 "application " | Freq: Once | CUTANEOUS | Status: AC
  Administered 2021-02-18: 2 via TOPICAL

## 2021-02-18 MED ORDER — PHENYLEPHRINE HCL-NACL 10-0.9 MG/250ML-% IV SOLN
INTRAVENOUS | Status: DC | PRN
  Administered 2021-02-18: 20 ug/min via INTRAVENOUS

## 2021-02-18 MED ORDER — OXYCODONE HCL 5 MG PO TABS
5.0000 mg | ORAL_TABLET | Freq: Once | ORAL | Status: DC | PRN
Start: 2021-02-18 — End: 2021-02-18

## 2021-02-18 MED ORDER — SODIUM CHLORIDE 0.9 % IR SOLN
Status: DC | PRN
  Administered 2021-02-18: 1000 mL

## 2021-02-18 MED ORDER — BUPIVACAINE-MELOXICAM ER 400-12 MG/14ML IJ SOLN
INTRAMUSCULAR | Status: DC | PRN
  Administered 2021-02-18 (×2): 200 mg

## 2021-02-18 MED ORDER — PHENOL 1.4 % MT LIQD: 1.0000 | OROMUCOSAL | Status: DC | PRN

## 2021-02-18 MED ORDER — VANCOMYCIN HCL 1000 MG IV SOLR
INTRAVENOUS | Status: DC | PRN
  Administered 2021-02-18: 1000 mg via TOPICAL

## 2021-02-18 MED ORDER — MIDAZOLAM HCL 5 MG/5ML IJ SOLN
INTRAMUSCULAR | Status: DC | PRN
Start: 2021-02-18 — End: 2021-02-18
  Administered 2021-02-18: 2 mg via INTRAVENOUS

## 2021-02-18 MED ORDER — METOCLOPRAMIDE HCL 5 MG/ML IJ SOLN: 5.0000 mg | Freq: Three times a day (TID) | INTRAMUSCULAR | Status: DC | PRN

## 2021-02-18 MED ORDER — FENTANYL CITRATE (PF) 100 MCG/2ML IJ SOLN
INTRAMUSCULAR | Status: DC | PRN
  Administered 2021-02-18: 50 ug via INTRAVENOUS
  Administered 2021-02-18: 100 ug via INTRAVENOUS
  Administered 2021-02-18 (×2): 50 ug via INTRAVENOUS

## 2021-02-18 MED ORDER — METHOCARBAMOL 1000 MG/10ML IJ SOLN
500.0000 mg | Freq: Four times a day (QID) | INTRAVENOUS | Status: DC | PRN
  Filled 2021-02-18: qty 5

## 2021-02-18 MED ORDER — ONDANSETRON HCL 4 MG PO TABS
4.0000 mg | ORAL_TABLET | Freq: Four times a day (QID) | ORAL | Status: DC | PRN
  Filled 2021-02-18: qty 1

## 2021-02-18 MED ORDER — HYDROMORPHONE HCL 1 MG/ML IJ SOLN: 0.5000 mg | INTRAMUSCULAR | Status: DC | PRN

## 2021-02-18 MED ORDER — MEPERIDINE HCL 25 MG/ML IJ SOLN: 6.2500 mg | INTRAMUSCULAR | Status: DC | PRN

## 2021-02-18 MED ORDER — OXYCODONE HCL 5 MG PO TABS: 10.0000 mg | ORAL_TABLET | ORAL | Status: DC | PRN

## 2021-02-18 MED ORDER — ACETAMINOPHEN 325 MG PO TABS: 325.0000 mg | ORAL_TABLET | ORAL | Status: DC | PRN

## 2021-02-18 MED ORDER — DEXAMETHASONE SODIUM PHOSPHATE 10 MG/ML IJ SOLN
10.0000 mg | Freq: Once | INTRAMUSCULAR | Status: AC
  Administered 2021-02-19: 10 mg via INTRAVENOUS
  Filled 2021-02-18: qty 1

## 2021-02-18 MED ORDER — FENTANYL CITRATE (PF) 250 MCG/5ML IJ SOLN
INTRAMUSCULAR | Status: AC
  Filled 2021-02-18: qty 5

## 2021-02-18 MED ORDER — CEFAZOLIN SODIUM-DEXTROSE 2-4 GM/100ML-% IV SOLN
2.0000 g | INTRAVENOUS | Status: AC
Start: 2021-02-18 — End: 2021-02-18
  Administered 2021-02-18: 2 g via INTRAVENOUS
  Filled 2021-02-18: qty 100

## 2021-02-18 MED ORDER — FENTANYL CITRATE (PF) 100 MCG/2ML IJ SOLN
25.0000 ug | INTRAMUSCULAR | Status: DC | PRN
  Administered 2021-02-18: 25 ug via INTRAVENOUS

## 2021-02-18 MED ORDER — METHOCARBAMOL 500 MG PO TABS
500.0000 mg | ORAL_TABLET | Freq: Four times a day (QID) | ORAL | Status: DC | PRN
  Administered 2021-02-18 – 2021-02-19 (×3): 500 mg via ORAL
  Filled 2021-02-18 (×3): qty 1

## 2021-02-18 MED ORDER — BUPIVACAINE HCL (PF) 0.5 % IJ SOLN
INTRAMUSCULAR | Status: DC | PRN
  Administered 2021-02-18: 20 mL via PERINEURAL

## 2021-02-18 MED ORDER — MENTHOL 3 MG MT LOZG: 1.0000 | LOZENGE | OROMUCOSAL | Status: DC | PRN

## 2021-02-18 MED ORDER — ACETAMINOPHEN 500 MG PO TABS
1000.0000 mg | ORAL_TABLET | Freq: Four times a day (QID) | ORAL | Status: AC
  Administered 2021-02-18 – 2021-02-19 (×4): 1000 mg via ORAL
  Filled 2021-02-18 (×4): qty 2

## 2021-02-18 MED ORDER — OXYCODONE HCL 5 MG PO TABS
5.0000 mg | ORAL_TABLET | ORAL | Status: DC | PRN
  Administered 2021-02-18: 5 mg via ORAL
  Administered 2021-02-18: 10 mg via ORAL
  Administered 2021-02-19: 5 mg via ORAL
  Filled 2021-02-18 (×2): qty 2
  Filled 2021-02-18: qty 1

## 2021-02-18 MED ORDER — METOPROLOL TARTRATE 25 MG PO TABS: 25.0000 mg | ORAL_TABLET | Freq: Every day | ORAL | Status: DC

## 2021-02-18 MED ORDER — TRANEXAMIC ACID 1000 MG/10ML IV SOLN
INTRAVENOUS | Status: DC | PRN
  Administered 2021-02-18: 2000 mg via TOPICAL

## 2021-02-18 MED ORDER — ACETAMINOPHEN 325 MG PO TABS: 325.0000 mg | ORAL_TABLET | Freq: Four times a day (QID) | ORAL | Status: DC | PRN

## 2021-02-18 MED ORDER — OXYCODONE HCL 5 MG/5ML PO SOLN: 5.0000 mg | Freq: Once | ORAL | Status: DC | PRN

## 2021-02-18 MED ORDER — ASPIRIN 81 MG PO CHEW
81.0000 mg | CHEWABLE_TABLET | Freq: Two times a day (BID) | ORAL | Status: DC
  Administered 2021-02-18 – 2021-02-19 (×2): 81 mg via ORAL
  Filled 2021-02-18 (×3): qty 1

## 2021-02-18 MED ORDER — MIDAZOLAM HCL 2 MG/2ML IJ SOLN
INTRAMUSCULAR | Status: AC
  Filled 2021-02-18: qty 2

## 2021-02-18 MED ORDER — CEFAZOLIN SODIUM-DEXTROSE 2-4 GM/100ML-% IV SOLN
2.0000 g | Freq: Four times a day (QID) | INTRAVENOUS | Status: AC
  Administered 2021-02-18 (×2): 2 g via INTRAVENOUS
  Filled 2021-02-18 (×2): qty 100

## 2021-02-18 MED ORDER — SODIUM CHLORIDE 0.9 % IV SOLN: INTRAVENOUS | Status: DC

## 2021-02-18 MED ORDER — FENTANYL CITRATE (PF) 100 MCG/2ML IJ SOLN
INTRAMUSCULAR | Status: AC
  Administered 2021-02-18: 25 ug via INTRAVENOUS
  Filled 2021-02-18: qty 2

## 2021-02-18 MED ORDER — DIAZEPAM 2 MG PO TABS: 2.0000 mg | ORAL_TABLET | Freq: Two times a day (BID) | ORAL | Status: DC | PRN

## 2021-02-18 MED ORDER — IRRISEPT - 450ML BOTTLE WITH 0.05% CHG IN STERILE WATER, USP 99.95% OPTIME
TOPICAL | Status: DC | PRN
  Administered 2021-02-18: 450 mL via TOPICAL

## 2021-02-18 MED ORDER — TRANEXAMIC ACID 1000 MG/10ML IV SOLN
2000.0000 mg | INTRAVENOUS | Status: DC
  Filled 2021-02-18: qty 20

## 2021-02-18 MED ORDER — ONDANSETRON HCL 4 MG/2ML IJ SOLN
INTRAMUSCULAR | Status: AC
  Filled 2021-02-18: qty 2

## 2021-02-18 MED ORDER — DEXAMETHASONE SODIUM PHOSPHATE 10 MG/ML IJ SOLN
INTRAMUSCULAR | Status: AC
  Filled 2021-02-18: qty 1

## 2021-02-18 MED ORDER — 0.9 % SODIUM CHLORIDE (POUR BTL) OPTIME
TOPICAL | Status: DC | PRN
  Administered 2021-02-18: 1000 mL

## 2021-02-18 MED ORDER — PROPOFOL 10 MG/ML IV BOLUS
INTRAVENOUS | Status: AC
  Filled 2021-02-18: qty 20

## 2021-02-18 MED ORDER — LACTATED RINGERS IV SOLN: INTRAVENOUS | Status: DC

## 2021-02-18 MED ORDER — ACETAMINOPHEN 160 MG/5ML PO SOLN: 325.0000 mg | ORAL | Status: DC | PRN

## 2021-02-18 MED ORDER — ONDANSETRON HCL 4 MG/2ML IJ SOLN
INTRAMUSCULAR | Status: DC | PRN
  Administered 2021-02-18: 4 mg via INTRAVENOUS

## 2021-02-18 MED ORDER — METOCLOPRAMIDE HCL 5 MG PO TABS: 5.0000 mg | ORAL_TABLET | Freq: Three times a day (TID) | ORAL | Status: DC | PRN

## 2021-02-18 SURGICAL SUPPLY — 77 items
ALCOHOL 70% 16 OZ (MISCELLANEOUS) ×2 IMPLANT
BAG COUNTER SPONGE SURGICOUNT (BAG) IMPLANT
BAG DECANTER FOR FLEXI CONT (MISCELLANEOUS) ×2 IMPLANT
BANDAGE ESMARK 6X9 LF (GAUZE/BANDAGES/DRESSINGS) IMPLANT
BLADE SAG 18X100X1.27 (BLADE) ×2 IMPLANT
BNDG ESMARK 6X9 LF (GAUZE/BANDAGES/DRESSINGS)
BOWL SMART MIX CTS (DISPOSABLE) ×2 IMPLANT
CEMENT BONE REFOBACIN R1X40 US (Cement) ×4 IMPLANT
CLSR STERI-STRIP ANTIMIC 1/2X4 (GAUZE/BANDAGES/DRESSINGS) ×4 IMPLANT
COMP FEMUR CMT NRW 10 RT (Joint) ×2 IMPLANT
COMPONENT FEMUR CMT NRW 10 RT (Joint) ×1 IMPLANT
COOLER ICEMAN CLASSIC (MISCELLANEOUS) ×2 IMPLANT
COVER SURGICAL LIGHT HANDLE (MISCELLANEOUS) ×2 IMPLANT
CUFF TOURN SGL QUICK 34 (TOURNIQUET CUFF) ×1
CUFF TOURN SGL QUICK 42 (TOURNIQUET CUFF) IMPLANT
CUFF TRNQT CYL 34X4.125X (TOURNIQUET CUFF) ×1 IMPLANT
DERMABOND ADVANCED (GAUZE/BANDAGES/DRESSINGS) ×1
DERMABOND ADVANCED .7 DNX12 (GAUZE/BANDAGES/DRESSINGS) ×1 IMPLANT
DRAPE EXTREMITY T 121X128X90 (DISPOSABLE) ×2 IMPLANT
DRAPE HALF SHEET 40X57 (DRAPES) ×2 IMPLANT
DRAPE INCISE IOBAN 66X45 STRL (DRAPES) IMPLANT
DRAPE ORTHO SPLIT 77X108 STRL (DRAPES) ×2
DRAPE POUCH INSTRU U-SHP 10X18 (DRAPES) ×2 IMPLANT
DRAPE SURG ORHT 6 SPLT 77X108 (DRAPES) ×2 IMPLANT
DRAPE U-SHAPE 47X51 STRL (DRAPES) ×4 IMPLANT
DRSG AQUACEL AG ADV 3.5X10 (GAUZE/BANDAGES/DRESSINGS) ×2 IMPLANT
DURAPREP 26ML APPLICATOR (WOUND CARE) ×6 IMPLANT
ELECT CAUTERY BLADE 6.4 (BLADE) ×2 IMPLANT
ELECT REM PT RETURN 9FT ADLT (ELECTROSURGICAL) ×2
ELECTRODE REM PT RTRN 9FT ADLT (ELECTROSURGICAL) ×1 IMPLANT
GLOVE SURG LTX SZ7 (GLOVE) ×6 IMPLANT
GLOVE SURG NEOP MICRO LF SZ7.5 (GLOVE) ×6 IMPLANT
GLOVE SURG SYN 7.5  E (GLOVE) ×4
GLOVE SURG SYN 7.5 E (GLOVE) ×4 IMPLANT
GLOVE SURG UNDER POLY LF SZ7 (GLOVE) ×10 IMPLANT
GOWN STRL REIN XL XLG (GOWN DISPOSABLE) ×2 IMPLANT
GOWN STRL REUS W/ TWL LRG LVL3 (GOWN DISPOSABLE) ×1 IMPLANT
GOWN STRL REUS W/TWL LRG LVL3 (GOWN DISPOSABLE) ×1
HANDPIECE INTERPULSE COAX TIP (DISPOSABLE) ×1
HDLS TROCR DRIL PIN KNEE 75 (PIN) ×1
HOOD PEEL AWAY FLYTE STAYCOOL (MISCELLANEOUS) ×4 IMPLANT
INSERT TIB ARTISURF SZ8-11X12 (Insert) ×2 IMPLANT
JET LAVAGE IRRISEPT WOUND (IRRIGATION / IRRIGATOR) ×2
KIT BASIN OR (CUSTOM PROCEDURE TRAY) ×2 IMPLANT
KIT TURNOVER KIT B (KITS) ×2 IMPLANT
LAVAGE JET IRRISEPT WOUND (IRRIGATION / IRRIGATOR) ×1 IMPLANT
MANIFOLD NEPTUNE II (INSTRUMENTS) ×2 IMPLANT
MARKER SKIN DUAL TIP RULER LAB (MISCELLANEOUS) ×2 IMPLANT
NEEDLE SPNL 18GX3.5 QUINCKE PK (NEEDLE) ×4 IMPLANT
NS IRRIG 1000ML POUR BTL (IV SOLUTION) ×2 IMPLANT
PACK TOTAL JOINT (CUSTOM PROCEDURE TRAY) ×2 IMPLANT
PAD ARMBOARD 7.5X6 YLW CONV (MISCELLANEOUS) ×4 IMPLANT
PAD COLD SHLDR WRAP-ON (PAD) ×2 IMPLANT
PIN DRILL HDLS TROCAR 75 4PK (PIN) ×1 IMPLANT
SAW OSC TIP CART 19.5X105X1.3 (SAW) ×2 IMPLANT
SCREW FEMALE HEX FIX 25X2.5 (ORTHOPEDIC DISPOSABLE SUPPLIES) ×2 IMPLANT
SET HNDPC FAN SPRY TIP SCT (DISPOSABLE) ×1 IMPLANT
STAPLER VISISTAT 35W (STAPLE) IMPLANT
STEM POLY PAT PLY 32M KNEE (Knees) ×2 IMPLANT
STEM TIBIA 5 DEG SZ E R KNEE (Knees) ×1 IMPLANT
SUCTION FRAZIER HANDLE 10FR (MISCELLANEOUS) ×1
SUCTION TUBE FRAZIER 10FR DISP (MISCELLANEOUS) ×1 IMPLANT
SUT ETHILON 2 0 FS 18 (SUTURE) IMPLANT
SUT MNCRL AB 4-0 PS2 18 (SUTURE) IMPLANT
SUT VIC AB 0 CT1 27 (SUTURE) ×2
SUT VIC AB 0 CT1 27XBRD ANBCTR (SUTURE) ×2 IMPLANT
SUT VIC AB 1 CTX 27 (SUTURE) ×6 IMPLANT
SUT VIC AB 2-0 CT1 27 (SUTURE) ×4
SUT VIC AB 2-0 CT1 TAPERPNT 27 (SUTURE) ×4 IMPLANT
SYR 50ML LL SCALE MARK (SYRINGE) ×4 IMPLANT
TIBIA STEM 5 DEG SZ E R KNEE (Knees) ×2 IMPLANT
TOWEL GREEN STERILE (TOWEL DISPOSABLE) ×2 IMPLANT
TOWEL GREEN STERILE FF (TOWEL DISPOSABLE) ×2 IMPLANT
TRAY CATH 16FR W/PLASTIC CATH (SET/KITS/TRAYS/PACK) IMPLANT
UNDERPAD 30X36 HEAVY ABSORB (UNDERPADS AND DIAPERS) ×2 IMPLANT
WRAP KNEE MAXI GEL POST OP (GAUZE/BANDAGES/DRESSINGS) ×2 IMPLANT
YANKAUER SUCT BULB TIP NO VENT (SUCTIONS) ×2 IMPLANT

## 2021-02-18 NOTE — Anesthesia Procedure Notes (Addendum)
Anesthesia Regional Block: Adductor canal block   Pre-Anesthetic Checklist: , timeout performed,  Correct Patient, Correct Site, Correct Laterality,  Correct Procedure, Correct Position, site marked,  Risks and benefits discussed,  Surgical consent,  Pre-op evaluation,  At surgeon's request and post-op pain management  Laterality: Right  Prep: chloraprep       Needles:  Injection technique: Single-shot  Needle Type: Echogenic Stimulator Needle     Needle Length: 5cm  Needle Gauge: 22     Additional Needles:   Procedures:, nerve stimulator,,, ultrasound used (permanent image in chart),,    Narrative:  Start time: 02/18/2021 7:10 AM End time: 02/18/2021 7:15 AM Injection made incrementally with aspirations every 5 mL.  Performed by: Personally  Anesthesiologist: Bethena Midget, MD  Additional Notes: Functioning IV was confirmed and monitors were applied.  A 60mm 22ga Arrow echogenic stimulator needle was used. Sterile prep and drape,hand hygiene and sterile gloves were used. Ultrasound guidance: relevant anatomy identified, needle position confirmed, local anesthetic spread visualized around nerve(s)., vascular puncture avoided.  Image printed for medical record. Negative aspiration and negative test dose prior to incremental administration of local anesthetic. The patient tolerated the procedure well.

## 2021-02-18 NOTE — Op Note (Signed)
Total Knee Arthroplasty Procedure Note  Preoperative diagnosis: Right knee osteoarthritis  Postoperative diagnosis:same  Operative procedure: Right total knee arthroplasty. CPT [phone]  Surgeon: N. Glee Arvin, MD  Assist: Oneal Grout, PA-C; necessary for the timely completion of procedure and due to complexity of procedure.  Anesthesia: Spinal, regional, local  Tourniquet time: see anesthesia record  Implants used: Zimmer persona Femur: CR 10 Tibia: E Patella: 32 mm Polyethylene: 12 mm, MC  Indication: Jodi Mcgrath is a 65 y.o. year old female with a history of knee pain. Having failed conservative management, the patient elected to proceed with a total knee arthroplasty.  We have reviewed the risk and benefits of the surgery and they elected to proceed after voicing understanding.  Procedure:  After informed consent was obtained and understanding of the risk were voiced including but not limited to bleeding, infection, damage to surrounding structures including nerves and vessels, blood clots, leg length inequality and the failure to achieve desired results, the operative extremity was marked with verbal confirmation of the patient in the holding area.   The patient was then brought to the operating room and transported to the operating room table in the supine position.  A tourniquet was applied to the operative extremity around the upper thigh. The operative limb was then prepped and draped in the usual sterile fashion and preoperative antibiotics were administered.  A time out was performed prior to the start of surgery confirming the correct extremity, preoperative antibiotic administration, as well as team members, implants and instruments available for the case. Correct surgical site was also confirmed with preoperative radiographs. The limb was then elevated for exsanguination and the tourniquet was inflated. A midline incision was made and a standard medial  parapatellar approach was performed.  The infrapatellar fat pad was removed.  Suprapatellar synovium was removed to reveal the anterior distal femoral cortex.  A medial peel was performed to release the capsule of the medial tibial plateau.  The patella was then everted and was prepared and sized to a 32 mm.  A cover was placed on the patella for protection from retractors.  The knee was then brought into full flexion and we then turned our attention to the femur.  There was grade 4 changes of both femoral condyles.  The cruciates were sacrificed.  Start site was drilled in the femur and the intramedullary distal femoral cutting guide was placed, set at 5 degrees valgus, taking 10 mm of distal resection. The distal cut was made. Osteophytes were then removed.  Next, the proximal tibial cutting guide was placed with appropriate slope, varus/valgus alignment and depth of resection. The proximal tibial cut was made. Gap blocks were then used to assess the extension gap and alignment, and appropriate soft tissue releases were performed. Attention was turned back to the femur, which was sized using the sizing guide to a size 10 narrow. Appropriate rotation of the femoral component was determined using epicondylar axis, Whiteside's line, and assessing the flexion gap under ligament tension. The appropriate size 4-in-1 cutting block was placed and checked with an angel wing and cuts were made. Posterior femoral osteophytes and uncapped bone were then removed with the curved osteotome.  Trial components were placed, and stability was checked in full extension, mid-flexion, and deep flexion. Proper tibial rotation was determined and marked.  The patella tracked well without a lateral release.  The femoral lugs were then drilled. Trial components were then removed and tibial preparation performed.  The tibia  was sized for a size E component.   The bony surfaces were irrigated with a pulse lavage and then dried. Bone cement  was vacuum mixed on the back table, and the final components sized above were cemented into place.  Antibiotic irrigation was placed in the knee joint and soft tissues while the cement cured.  After cement had finished curing, excess cement was removed. The stability of the construct was re-evaluated throughout a range of motion and found to be acceptable. The trial liner was removed, the knee was copiously irrigated, and the knee was re-evaluated for any excess bone debris. The real polyethylene liner, 12 mm thick, was inserted and checked to ensure the locking mechanism had engaged appropriately. The tourniquet was deflated and hemostasis was achieved. The wound was irrigated with normal saline.  One gram of vancomycin powder was placed in the surgical bed.  Capsular closure was performed with a #1 vicryl, subcutaneous fat closed with a 0 vicryl suture, then subcutaneous tissue closed with interrupted 2.0 vicryl suture. The skin was then closed with a 2.0 nylon and dermabond. A sterile dressing was applied.  The patient was awakened in the operating room and taken to recovery in stable condition. All sponge, needle, and instrument counts were correct at the end of the case.  Tessa Lerner was necessary for opening, closing, retracting, limb positioning and overall facilitation and completion of the surgery.  Position: supine  Complications: none.  Time Out: performed   Drains/Packing: none  Estimated blood loss: minimal  Returned to Recovery Room: in good condition.   Antibiotics: yes   Mechanical VTE (DVT) Prophylaxis: sequential compression devices, TED thigh-high  Chemical VTE (DVT) Prophylaxis: aspirin  Fluid Replacement  Crystalloid: see anesthesia record Blood: none  FFP: none   Specimens Removed: 1 to pathology   Sponge and Instrument Count Correct? yes   PACU: portable radiograph - knee AP and Lateral   Plan/RTC: Return in 2 weeks for wound check.   Weight Bearing/Load  Lower Extremity: full   Implant Name Type Inv. Item Serial No. Manufacturer Lot No. LRB No. Used Action  CEMENT BONE REFOBACIN [id number] [place] - [id number] Cement CEMENT BONE REFOBACIN [id number] [place]  ZIMMER RECON(ORTH,TRAU,BIO,SG) [id number] Right 2 Implanted  STEM POLY PAT PLY 63M KNEE - [id number] Knees STEM POLY PAT PLY 63M KNEE  ZIMMER RECON(ORTH,TRAU,BIO,SG) [id number] Right 1 Implanted  persona femur cemented cruciate retaining narrow right    ZIMMER KNEE [id number] Right 1 Implanted  TIBIA STEM 5 DEG SZ E R KNEE - [id number] Knees TIBIA STEM 5 DEG SZ E R KNEE  ZIMMER RECON(ORTH,TRAU,BIO,SG) [id number] Right 1 Implanted  INSERT TIB ARTISURF [id number] - [id number] Insert INSERT TIB ARTISURF [id number]  ZIMMER RECON(ORTH,TRAU,BIO,SG) [id number] Right 1 Implanted    N. Glee Arvin, MD [facility] 8:55 AM

## 2021-02-18 NOTE — Transfer of Care (Signed)
Immediate Anesthesia Transfer of Care Note  Patient: Jodi Mcgrath  Procedure(s) Performed: RIGHT TOTAL KNEE ARTHROPLASTY (Right: Knee)  Patient Location: PACU  Anesthesia Type:Spinal  Level of Consciousness: awake, alert  and oriented  Airway & Oxygen Therapy: Patient Spontanous Breathing  Post-op Assessment: Report given to RN and Post -op Vital signs reviewed and stable  Post vital signs: Reviewed and stable  Last Vitals:  Vitals Value Taken Time  BP 128/72 02/18/21 0936  Temp    Pulse 86 02/18/21 0938  Resp 20 02/18/21 0938  SpO2 94 % 02/18/21 0938  Vitals shown include unvalidated device data.  Last Pain:  Vitals:   02/18/21 0637  PainSc: 0-No pain         Complications: No notable events documented.

## 2021-02-18 NOTE — Care Plan (Signed)
Ortho Bundle Case Management Note  Patient Details  Name: Jodi Mcgrath MRN: [id number] Date of Birth: [birth year]  [facility] call to patient to discuss her Right total knee arthroplasty with Dr. Roda Shutters. She is an Ortho bundle patient through [facility] and is agreeable to case management. She lives with her husband, who be assisting at home after discharge. She was provided a CPM, FWW, and 3in1 by [employer] already. She needs no other DME. Anticipate HHPT will be needed after short hospital stay. Referral made to [facility] after choice provided. Reviewed all post op care instructions. Will continue to follow for needs.                   DME Arranged:  3-N-1, CPM, Walker rolling DME Agency:  [employer]  HH Arranged:  PT HH Agency:  [facility]  Additional Comments: Please contact me with any questions of if this plan should need to change.  Ralph Dowdy, RN, BSN, [employer]  [phone] 02/18/2021, 3:34 PM

## 2021-02-18 NOTE — H&P (Signed)
PREOPERATIVE H&P  Chief Complaint: right knee degenerative joint disease  HPI: Jodi Mcgrath is a 65 y.o. female who presents for surgical treatment of right knee degenerative joint disease.  She denies any changes in medical history.  Past Medical History:  Diagnosis Date   Anxiety    Depression    Fibromyalgia    H/o Lyme disease    History of kidney stones    Hypertension    Osteoarthritis    Osteoarthritis of right knee 06/27/2020   Osteoporosis    Overweight (BMI 25.0-29.9) 07/26/2019   Preoperative evaluation to rule out surgical contraindication 11/23/2020   Sensorineural hearing loss 10/02/2020   SVT (supraventricular tachycardia) (HCC)    Past Surgical History:  Procedure Laterality Date   ABDOMINAL HYSTERECTOMY     CARDIAC ELECTROPHYSIOLOGY STUDY AND ABLATION     DILATION AND CURETTAGE, DIAGNOSTIC / THERAPEUTIC     KNEE CARTILAGE SURGERY     LITHOTRIPSY     Social History   Socioeconomic History   Marital status: Married    Spouse name: Not on file   Number of children: Not on file   Years of education: Not on file   Highest education level: Not on file  Occupational History   Not on file  Tobacco Use   Smoking status: Never   Smokeless tobacco: Never  Vaping Use   Vaping Use: Never used  Substance and Sexual Activity   Alcohol use: Never   Drug use: Never   Sexual activity: Not on file  Other Topics Concern   Not on file  Social History Narrative   Not on file   Social Determinants of Health   Financial Resource Strain: Not on file  Food Insecurity: Not on file  Transportation Needs: Not on file  Physical Activity: Not on file  Stress: Not on file  Social Connections: Not on file   Family History  Problem Relation Age of Onset   CAD Father        CABG in 37's   Non-Hodgkin's lymphoma Father    CAD Brother        CABG at 26 and SCD at 70   Diabetes Brother    Diabetes Brother    Allergies  Allergen Reactions    Methylprednisolone Rash    Flushing, headache   Mucinex [Guaifenesin Er] Rash   Prior to Admission medications   Medication Sig Start Date End Date Taking? Authorizing Provider  aspirin EC 81 MG tablet Take 1 tablet (81 mg total) by mouth 2 (two) times daily. To be taken after surgery 02/12/21   Cristie Hem, PA-C  diazepam (VALIUM) 2 MG tablet Take 2 mg by mouth 2 (two) times daily as needed for dizziness. 09/19/20  Yes [provider]  docusate sodium (COLACE) 100 MG capsule Take 1 capsule (100 mg total) by mouth daily as needed. 02/12/21 02/12/22  Cristie Hem, PA-C  methocarbamol (ROBAXIN) 500 MG tablet Take 1 tablet (500 mg total) by mouth 2 (two) times daily as needed. To be taken after surgery 02/12/21   Cristie Hem, PA-C  metoprolol tartrate (LOPRESSOR) 25 MG tablet Take 1 tablet (25 mg total) by mouth daily. Patient taking differently: Take 25 mg by mouth every evening. 01/28/21 04/28/21 Yes Pray, Milus Mallick, MD  ondansetron (ZOFRAN) 4 MG tablet Take 1 tablet (4 mg total) by mouth every 8 (eight) hours as needed for nausea or vomiting. 02/12/21   Cristie Hem, PA-C  ondansetron (  ZOFRAN-ODT) 4 MG disintegrating tablet Take 4 mg by mouth every 8 (eight) hours as needed for nausea/vomiting. 09/19/20  Yes [provider]  oxyCODONE-acetaminophen (PERCOCET) 5-325 MG tablet Take 1-2 tablets by mouth every 6 (six) hours as needed. To be taken after surgery 02/12/21   Cristie Hem, PA-C  meclizine (ANTIVERT) 25 MG tablet Take 1 tablet (25 mg total) by mouth 3 (three) times daily as needed for dizziness. Patient not taking: Reported on 02/05/2021 01/11/21   Billey Co, MD     Positive ROS: All other systems have been reviewed and were otherwise negative with the exception of those mentioned in the HPI and as above.  Physical Exam: General: Alert, no acute distress Cardiovascular: No pedal edema Respiratory: No cyanosis, no use of accessory musculature GI:  abdomen soft Skin: No lesions in the area of chief complaint Neurologic: Sensation intact distally Psychiatric: Patient is competent for consent with normal mood and affect Lymphatic: no lymphedema  MUSCULOSKELETAL: exam stable  Assessment: right knee degenerative joint disease  Plan: Plan for Procedure(s): RIGHT TOTAL KNEE ARTHROPLASTY  The risks benefits and alternatives were discussed with the patient including but not limited to the risks of nonoperative treatment, versus surgical intervention including infection, bleeding, nerve injury,  blood clots, cardiopulmonary complications, morbidity, mortality, among others, and they were willing to proceed.   Preoperative templating of the joint replacement has been completed, documented, and submitted to the Operating Room personnel in order to optimize intra-operative equipment management.   Glee Arvin, MD 02/18/2021 6:01 AM

## 2021-02-18 NOTE — Anesthesia Postprocedure Evaluation (Signed)
Anesthesia Post Note  Patient: Jodi Mcgrath  Procedure(s) Performed: RIGHT TOTAL KNEE ARTHROPLASTY (Right: Knee)     Patient location during evaluation: PACU Anesthesia Type: MAC and Spinal Level of consciousness: oriented and awake and alert Pain management: pain level controlled Vital Signs Assessment: post-procedure vital signs reviewed and stable Respiratory status: spontaneous breathing, respiratory function stable and patient connected to nasal cannula oxygen Cardiovascular status: blood pressure returned to baseline and stable Postop Assessment: no headache, no backache and no apparent nausea or vomiting Anesthetic complications: no   No notable events documented.  Last Vitals:  Vitals:   02/18/21 1022 02/18/21 1044  BP: 136/81 (!) 142/86  Pulse: 80 78  Resp: 10 20  Temp: 36.8 C 36.6 C  SpO2: 92% 97%    Last Pain:  Vitals:   02/18/21 1044  TempSrc: Oral  PainSc: 3                  York Valliant

## 2021-02-18 NOTE — Anesthesia Procedure Notes (Signed)
Spinal  Patient location during procedure: OR Start time: 02/18/2021 7:11 AM End time: 02/18/2021 7:17 AM Reason for block: surgical anesthesia Staffing Anesthesiologist: Bethena Midget, MD Preanesthetic Checklist Completed: patient identified, IV checked, site marked, risks and benefits discussed, surgical consent, monitors and equipment checked, pre-op evaluation and timeout performed Spinal Block Patient position: sitting Prep: DuraPrep Patient monitoring: heart rate, cardiac monitor, continuous pulse ox and blood pressure Approach: midline Location: L3-4 Injection technique: single-shot Needle Needle type: Sprotte  Needle gauge: 24 G Needle length: 9 cm Assessment Sensory level: T4 Events: CSF return

## 2021-02-18 NOTE — Discharge Instructions (Signed)

## 2021-02-18 NOTE — Progress Notes (Signed)
Physical Therapy Evaluation Patient Details Name: Jodi Mcgrath MRN: [id number] DOB: 09-10-1955 Today's Date: 02/18/2021   History of Present Illness  Pt is 65yo female s/p R TKA on 8/1. PMH: HTN, OP, anxiety, depression, fibromyalgia  Clinical Impression  Pt presents with problems above and deficits below. Required min assist to min guard for bed mobility and transfers using RW. Pt had dizziness during stand pivot transfers so further mobility deferred. Symptoms improved upon sitting. Pt educated on knee precautions and given ankle pumps to perform hourly. We will follow this patient acutely as she will benefit from acute skilled therapy to promote functional independence with mobility.    Follow Up Recommendations Follow surgeon's recommendation for DC plan and follow-up therapies    Equipment Recommendations  None recommended by PT (Pt has necessary equipment)    Recommendations for Other Services       Precautions / Restrictions Precautions Precautions: Knee Precaution Booklet Issued: No Precaution Comments: Verbally reviewed knee precautions with pt. Restrictions Weight Bearing Restrictions: Yes RLE Weight Bearing: Weight bearing as tolerated      Mobility  Bed Mobility Overal bed mobility: Needs Assistance Bed Mobility: Supine to Sit     Supine to sit: Min assist     General bed mobility comments: Pt required min A for RLE movement out of bed    Transfers Overall transfer level: Needs assistance Equipment used: Rolling walker (2 wheeled) Transfers: Sit to/from [employer] Sit to Stand: Min guard Stand pivot transfers: Min guard       General transfer comment: Pt required min guard for safety during transfers using RW; during stand pivot transfer patient displayed pallor and reporting dizziness so mobility limited to chair; resolved upon sitting. Cues for sequencing using RW.  Ambulation/Gait             General Gait Details:  deferred  Stairs            Wheelchair Mobility    Modified Rankin (Stroke Patients Only)       Balance Overall balance assessment: Needs assistance Sitting-balance support: No upper extremity supported;Feet supported Sitting balance-Leahy Scale: Fair     Standing balance support: Bilateral upper extremity supported Standing balance-Leahy Scale: Poor Standing balance comment: Reliant on BUE support                             Pertinent Vitals/Pain Pain Assessment: Faces Faces Pain Scale: Hurts a little bit Pain Location: right knee Pain Descriptors / Indicators: Discomfort Pain Intervention(s): Limited activity within patient's tolerance;Monitored during session;Repositioned    Home Living Family/patient expects to be discharged to:: Private residence Living Arrangements: Spouse/significant other Available Help at Discharge: Family;Available 24 hours/day Type of Home: House Home Access: Stairs to enter Entrance Stairs-Rails: Can reach both Entrance Stairs-Number of Steps: 2 Home Layout: One level Home Equipment: Walker - 2 wheels;Toilet riser;Bedside commode      Prior Function Level of Independence: Independent               Hand Dominance        Extremity/Trunk Assessment   Upper Extremity Assessment Upper Extremity Assessment: Overall WFL for tasks assessed    Lower Extremity Assessment Lower Extremity Assessment: RLE deficits/detail RLE Deficits / Details: Decreased ROM and pain secondary to R TKA RLE Sensation: WNL    Cervical / Trunk Assessment Cervical / Trunk Assessment: Normal  Communication   Communication: No difficulties  Cognition Arousal/Alertness: Awake/alert Behavior  During Therapy: WFL for tasks assessed/performed Overall Cognitive Status: Within Functional Limits for tasks assessed                                        General Comments General comments (skin integrity, edema, etc.): Pt's  husband [name] in room    Exercises Total Joint Exercises Ankle Circles/Pumps: AROM;Right;20 reps;Seated   Assessment/Plan    PT Assessment Patient needs continued PT services  PT Problem List Decreased strength;Decreased range of motion;Decreased activity tolerance;Decreased balance;Decreased mobility;Pain;Decreased knowledge of precautions;Decreased knowledge of use of DME       PT Treatment Interventions DME instruction;Gait training;Stair training;Functional mobility training;Therapeutic activities;Therapeutic exercise;Balance training;Patient/family education    PT Goals (Current goals can be found in the Care Plan section)  Acute Rehab PT Goals Patient Stated Goal: To go home PT Goal Formulation: With patient/family Time For Goal Achievement: 03/04/21 Potential to Achieve Goals: Good    Frequency 7X/week   Barriers to discharge        Co-evaluation               AM-PAC PT "6 Clicks" Mobility  Outcome Measure Help needed turning from your back to your side while in a flat bed without using bedrails?: A Little Help needed moving from lying on your back to sitting on the side of a flat bed without using bedrails?: A Little Help needed moving to and from a bed to a chair (including a wheelchair)?: A Little Help needed standing up from a chair using your arms (e.g., wheelchair or bedside chair)?: A Little Help needed to walk in hospital room?: A Little Help needed climbing 3-5 steps with a railing? : A Little 6 Click Score: 18    End of Session Equipment Utilized During Treatment: Gait belt Activity Tolerance: Patient limited by pain;Treatment limited secondary to medical complications (Comment) (dizziness) Patient left: in chair;with call bell/phone within reach;with family/visitor present Nurse Communication: Mobility status PT Visit Diagnosis: Pain;Muscle weakness (generalized) (M62.81);Difficulty in walking, not elsewhere classified (R26.2) Pain - Right/Left:  Right Pain - part of body: Knee    Time: 3557-3220 PT Time Calculation (min) (ACUTE ONLY): 24 min   Charges:   PT Evaluation $PT Eval Low Complexity: 1 Low PT Treatments $Therapeutic Activity: 8-22 mins        [name], SPT  [name] 02/18/2021, 3:50 PM

## 2021-02-19 ENCOUNTER — Encounter (HOSPITAL_COMMUNITY): Payer: Self-pay | Admitting: Orthopaedic Surgery

## 2021-02-19 DIAGNOSIS — M1711 Unilateral primary osteoarthritis, right knee: Secondary | ICD-10-CM | POA: Diagnosis not present

## 2021-02-19 LAB — BASIC METABOLIC PANEL
Anion gap: 10 (ref 5–15)
BUN: 18 mg/dL (ref 8–23)
CO2: 23 mmol/L (ref 22–32)
Calcium: 9.3 mg/dL (ref 8.9–10.3)
Chloride: 102 mmol/L (ref 98–111)
Creatinine, Ser: 1 mg/dL (ref 0.44–1.00)
GFR, Estimated: >60 mL/min (ref >60)
Glucose, Bld: 168 mg/dL — ABNORMAL HIGH (ref 70–99)
Potassium: 4.6 mmol/L (ref 3.5–5.1)
Sodium: 135 mmol/L (ref 135–145)

## 2021-02-19 LAB — CBC
HCT: 37.5 % (ref 36.0–46.0)
Hemoglobin: 12.8 g/dL (ref 12.0–15.0)
MCH: 28.6 pg (ref 26.0–34.0)
MCHC: 34.1 g/dL (ref 30.0–36.0)
MCV: 83.7 fL (ref 80.0–100.0)
Platelets: 359 10*3/uL (ref 150–400)
RBC: 4.48 MIL/uL (ref 3.87–5.11)
RDW: 13.2 % (ref 11.5–15.5)
WBC: 16.3 10*3/uL — ABNORMAL HIGH (ref 4.0–10.5)
nRBC: 0 % (ref 0.0–0.2)

## 2021-02-19 NOTE — Evaluation (Signed)
Occupational Therapy Evaluation/Discharge Patient Details Name: Jodi Mcgrath MRN: [id number] DOB: 1956/06/13 Today's Date: 02/19/2021    History of Present Illness Pt is 65yo female s/p R TKA on 8/1. PMH: HTN, OP, anxiety, depression, fibromyalgia   Clinical Impression   PTA, pt lives with spouse who plans to assist with ADLs/IADLs as needed. Per pt, her and her husband practiced various scenarios prior to sx, as well as ensuring home setup appropriate for optimal safety. Pt reports going to/from bathrooom and ambulating in hallway using RW without difficulty during this admission. Educated on strategies for LB ADLs, use of AE as needed with pt reporting likely no difficulties with these tasks. Pt verbalized understanding of all education though politely declined to return demo ADLs with OT this AM. No further skilled OT services needed at acute level or on DC.     Follow Up Recommendations  No OT follow up;Supervision - Intermittent    Equipment Recommendations  None recommended by OT (has all needed DME)    Recommendations for Other Services       Precautions / Restrictions Precautions Precautions: Knee Precaution Booklet Issued: No Restrictions Weight Bearing Restrictions: Yes RLE Weight Bearing: Weight bearing as tolerated             ADL either performed or assessed with clinical judgement   ADL Overall ADL's : Modified independent      General ADL Comments: Reports ambulating in hallway, to/from bathroom. Pt and husband practiced various scenarios prior to surgery (car transfers, home setup, etc). Educated on strategies for LB dressinig with pt verbalizing understanding. Did not return demo strategies due to pt believing capable of completing later today. Educated on AE uses with pt planning to have husband assist as needed     Vision Patient Visual Report: No change from baseline Vision Assessment?: No apparent visual deficits     Perception     Praxis       Pertinent Vitals/Pain Pain Assessment: No/denies pain ("no, not really" when asked if experiencing pain) Pain Intervention(s): Monitored during session     Hand Dominance Right   Extremity/Trunk Assessment Upper Extremity Assessment Upper Extremity Assessment: Overall WFL for tasks assessed   Lower Extremity Assessment Lower Extremity Assessment: Defer to PT evaluation   Cervical / Trunk Assessment Cervical / Trunk Assessment: Normal   Communication Communication Communication: No difficulties   Cognition Arousal/Alertness: Awake/alert Behavior During Therapy: WFL for tasks assessed/performed Overall Cognitive Status: Within Functional Limits for tasks assessed        General Comments  Pt eating breakfast this AM, declined need for ADL practice though very participatory in education. Encouraged continued activity at home    Exercises     Shoulder Instructions      Home Living Family/patient expects to be discharged to:: Private residence Living Arrangements: Spouse/significant other Available Help at Discharge: Family;Available 24 hours/day Type of Home: House Home Access: Stairs to enter [employer] of Steps: 2 Entrance Stairs-Rails: Can reach both Home Layout: One level     Bathroom Shower/Tub: Tub/shower unit;Walk-in shower   Bathroom Toilet: Standard Bathroom Accessibility: No   Home Equipment: Environmental consultant - 2 wheels;Toilet riser;Bedside commode   Additional Comments: Plans to use BSC as shower chair and/or over toilet      Prior Functioning/Environment Level of Independence: Independent                 OT Problem List:        OT Treatment/Interventions:  OT Goals(Current goals can be found in the care plan section) Acute Rehab OT Goals Patient Stated Goal: To go home OT Goal Formulation: All assessment and education complete, DC therapy  OT Frequency:     Barriers to D/C:            Co-evaluation               AM-PAC OT "6 Clicks" Daily Activity     Outcome Measure Help from another person eating meals?: None Help from another person taking care of personal grooming?: None Help from another person toileting, which includes using toliet, bedpan, or urinal?: None Help from another person bathing (including washing, rinsing, drying)?: None Help from another person to put on and taking off regular upper body clothing?: None Help from another person to put on and taking off regular lower body clothing?: None 6 Click Score: 24   End of Session    Activity Tolerance: Patient tolerated treatment well Patient left: in bed;with call bell/phone within reach (eating breakfast)  OT Visit Diagnosis: Other abnormalities of gait and mobility (R26.89)                Time: 5885-0277 OT Time Calculation (min): 9 min Charges:  OT General Charges $OT Visit: 1 Visit OT Evaluation $OT Eval Low Complexity: 1 Low  Bradd Canary, OTR/L Acute Rehab Services Office: [phone]   Lorre Munroe 02/19/2021, 7:10 AM

## 2021-02-19 NOTE — Discharge Summary (Signed)
Patient ID: Jodi Mcgrath MRN: [id number] DOB/AGE: July 17, 1956 65 y.o.  Admit date: 02/18/2021 Discharge date: 02/19/2021  Admission Diagnoses:  Principal Problem:   Osteoarthritis of right knee Active Problems:   Status post total right knee replacement   Discharge Diagnoses:  Same  Past Medical History:  Diagnosis Date   Anxiety    Depression    Fibromyalgia    H/o Lyme disease    History of kidney stones    Hypertension    Osteoarthritis    Osteoarthritis of right knee 06/27/2020   Osteoporosis    Overweight (BMI 25.0-29.9) 07/26/2019   Preoperative evaluation to rule out surgical contraindication 11/23/2020   Sensorineural hearing loss 10/02/2020   SVT (supraventricular tachycardia) (HCC)     Surgeries: Procedure(s): RIGHT TOTAL KNEE ARTHROPLASTY on 02/18/2021   Consultants:   Discharged Condition: Improved  Hospital Course: Jodi Mcgrath is an 65 y.o. female who was admitted 02/18/2021 for operative treatment ofOsteoarthritis of right knee. Patient has severe unremitting pain that affects sleep, daily activities, and work/hobbies. After pre-op clearance the patient was taken to the operating room on 02/18/2021 and underwent  Procedure(s): RIGHT TOTAL KNEE ARTHROPLASTY.    Patient was given perioperative antibiotics:  Anti-infectives (From admission, onward)    Start     Dose/Rate Route Frequency Ordered Stop   02/18/21 1300  ceFAZolin (ANCEF) IVPB 2g/100 mL premix        2 g 200 mL/hr over 30 Minutes Intravenous Every 6 hours 02/18/21 1049 02/18/21 1828   02/18/21 0800  ceFAZolin (ANCEF) IVPB 2g/100 mL premix        2 g 200 mL/hr over 30 Minutes Intravenous On call to O.R. 02/18/21 0615 02/18/21 0719   02/18/21 0722  vancomycin (VANCOCIN) powder  Status:  Discontinued          As needed 02/18/21 0722 02/18/21 0931        Patient was given sequential compression devices, early ambulation, and chemoprophylaxis to prevent DVT.  Patient benefited maximally  from hospital stay and there were no complications.    Recent vital signs: Patient Vitals for the past 24 hrs:  BP Temp Temp src Pulse Resp SpO2  02/19/21 0734 128/71 97.8 F (36.6 C) Oral 72 16 96 %  02/19/21 0509 126/71 98 F (36.7 C) Oral 74 18 98 %  02/19/21 0004 123/74 98.1 F (36.7 C) Oral 77 18 96 %  02/18/21 2022 (!) 141/85 (!) 97.5 F (36.4 C) Oral 82 20 96 %  02/18/21 1629 (!) 144/84 (!) 97.5 F (36.4 C) Oral 87 16 97 %  02/18/21 1156 (!) 152/98 97.7 F (36.5 C) Oral 75 16 98 %  02/18/21 1044 (!) 142/86 97.8 F (36.6 C) Oral 78 20 97 %  02/18/21 1022 136/81 98.2 F (36.8 C) -- 80 10 92 %  02/18/21 1006 135/80 -- -- 77 13 93 %  02/18/21 0951 131/87 -- -- 83 20 93 %  02/18/21 0937 128/72 98.2 F (36.8 C) -- 88 20 93 %     Recent laboratory studies:  Recent Labs    02/19/21 0111  WBC 16.3*  HGB 12.8  HCT 37.5  PLT 359  NA 135  K 4.6  CL 102  CO2 23  BUN 18  CREATININE 1.00  GLUCOSE 168*  CALCIUM 9.3     Discharge Medications:   Allergies as of 02/19/2021       Reactions   Methylprednisolone Rash   Flushing, headache   Mucinex [guaifenesin Er]  Rash        Medication List     STOP taking these medications    meclizine 25 MG tablet Commonly known as: ANTIVERT       TAKE these medications    aspirin EC 81 MG tablet Take 1 tablet (81 mg total) by mouth 2 (two) times daily. To be taken after surgery   diazepam 2 MG tablet Commonly known as: VALIUM Take 2 mg by mouth 2 (two) times daily as needed for dizziness.   docusate sodium 100 MG capsule Commonly known as: Colace Take 1 capsule (100 mg total) by mouth daily as needed.   methocarbamol 500 MG tablet Commonly known as: Robaxin Take 1 tablet (500 mg total) by mouth 2 (two) times daily as needed. To be taken after surgery   ondansetron 4 MG disintegrating tablet Commonly known as: ZOFRAN-ODT Take 4 mg by mouth every 8 (eight) hours as needed for nausea/vomiting.   ondansetron 4 MG  tablet Commonly known as: Zofran Take 1 tablet (4 mg total) by mouth every 8 (eight) hours as needed for nausea or vomiting.   oxyCODONE-acetaminophen 5-325 MG tablet Commonly known as: Percocet Take 1-2 tablets by mouth every 6 (six) hours as needed. To be taken after surgery       ASK your doctor about these medications    metoprolol tartrate 25 MG tablet Commonly known as: LOPRESSOR Take 1 tablet (25 mg total) by mouth daily.               Durable Medical Equipment  (From admission, onward)           Start     Ordered   02/18/21 1050  DME Walker rolling  Once       Question Answer Comment  Walker: With 5 Inch Wheels   Patient needs a walker to treat with the following condition Status post left partial knee replacement      02/18/21 1049   02/18/21 1050  DME 3 n 1  Once        02/18/21 1049   02/18/21 1050  DME Bedside commode  Once       Question:  Patient needs a bedside commode to treat with the following condition  Answer:  Status post left partial knee replacement   02/18/21 1049            Diagnostic Studies: DG Chest 2 View  Result Date: 02/08/2021 CLINICAL DATA:  Preoperative evaluation for RIGHT total knee replacement EXAM: CHEST - 2 VIEW COMPARISON:  None FINDINGS: Normal heart size, mediastinal contours, and pulmonary vascularity. Mild bronchitic changes. No pulmonary infiltrate, pleural effusion, or pneumothorax. Mild osseous demineralization. IMPRESSION: Bronchitic changes without acute infiltrate. Electronically Signed   By: Ulyses Southward M.D.   On: 02/08/2021 15:35   DG Knee Right Port  Result Date: 02/18/2021 CLINICAL DATA:  Post right knee replacement EXAM: PORTABLE RIGHT KNEE - 1-2 VIEW COMPARISON:  10/11/2020 FINDINGS: Changes of right knee replacement. Soft tissue and joint space gas. No hardware or bony complicating feature. IMPRESSION: Right knee replacement.  No visible complicating feature. Electronically Signed   By: Charlett Nose M.D.    On: 02/18/2021 12:20   MYOCARDIAL PERFUSION IMAGING  Result Date: 01/28/2021  The left ventricular ejection fraction is hyperdynamic (>65%).  Nuclear stress EF: 78%.  There was no ST segment deviation noted during stress.  No T wave inversion was noted during stress.  The study is normal.  This is a low  risk study.  IMPRESSIONS Negative for stress induced arrhythmias. Hyperdynamic LV function. No evidence of ischemia or infarction. RECOMMENDATIONS/CONCLUSIONS Stress test is negative. The study is consistent with a low risk study.    Disposition: Discharge disposition: 01-Home or Self Care          Follow-up Information     Tarry Kos, MD. Go on 03/01/2021.   Specialty: Orthopedic Surgery Why: at 10:00 am for your 2 week post op appointment with Dr. Clarisa Kindred information: [address] [place] [address] [phone]         [facility] Follow up.   Specialty: Rehabilitation Why: This appointment is pending. Your office RN Case manager will assist with scheduling. Contact information: [address] [place] [address] [phone]                 Signed: Cristie Hem 02/19/2021, 7:58 AM

## 2021-02-19 NOTE — Progress Notes (Signed)
Patient was transported via wheelchair by volunteer for discharge home; in no acute distress nor complaints of pain nor discomfort; room was checked and accounted for all her belongings; discharge instructions given to patient by RN and patient verbalized understanding on the instructions given. 

## 2021-02-19 NOTE — Progress Notes (Signed)
Subjective: 1 Day Post-Op Procedure(s) (LRB): RIGHT TOTAL KNEE ARTHROPLASTY (Right) Patient reports pain as mild.  Walked the halls overnight with nursing.  Currently feeling great without complaints.   Objective: Vital signs in last 24 hours: Temp:  [97.5 F (36.4 C)-98.2 F (36.8 C)] 97.8 F (36.6 C) (08/02 0734) Pulse Rate:  [72-88] 72 (08/02 0734) Resp:  [10-20] 16 (08/02 0734) BP: (123-152)/(71-98) 128/71 (08/02 0734) SpO2:  [92 %-98 %] 96 % (08/02 0734)  Intake/Output from previous day: 08/01 0701 - 08/02 0700 In: 1500 [I.V.:1300; IV Piggyback:200] Out: 1700 [Urine:1600; Blood:100] Intake/Output this shift: No intake/output data recorded.  Recent Labs    02/19/21 0111  HGB 12.8   Recent Labs    02/19/21 0111  WBC 16.3*  RBC 4.48  HCT 37.5  PLT 359   Recent Labs    02/19/21 0111  NA 135  K 4.6  CL 102  CO2 23  BUN 18  CREATININE 1.00  GLUCOSE 168*  CALCIUM 9.3   No results for input(s): LABPT, INR in the last 72 hours.  Neurologically intact Neurovascular intact Sensation intact distally Intact pulses distally Dorsiflexion/Plantar flexion intact Incision: dressing C/D/I No cellulitis present Compartment soft   Assessment/Plan: 1 Day Post-Op Procedure(s) (LRB): RIGHT TOTAL KNEE ARTHROPLASTY (Right) Advance diet Up with therapy D/C IV fluids Discharge home with home health after first or second PT session.  Up to patient and howe well she progresses with PT WBAT RLE    Anticipated LOS equal to or greater than 2 midnights due to - Age 67 and older with one or more of the following:  - Obesity  - Expected need for hospital services (PT, OT, Nursing) required for safe  discharge  - Anticipated need for postoperative skilled nursing care or inpatient rehab  - Active co-morbidities: None OR   - Unanticipated findings during/Post Surgery: None  - Patient is a high risk of re-admission due to: None   Cristie Hem 02/19/2021, 7:56 AM

## 2021-02-19 NOTE — Progress Notes (Signed)
Physical Therapy Treatment Patient Details Name: Jodi Mcgrath MRN: [id number] DOB: 1956/06/21 Today's Date: 02/19/2021    History of Present Illness Pt is 65yo female s/p R TKA on 8/1. PMH: HTN, OP, anxiety, depression, fibromyalgia    PT Comments    Pt required supervision bed mobility, min guard assist transfers, min guard assist ambulation 150' with RW, and min assist ascend/descend 3 steps with 1 rail. Pt performed LE exercises supine in bed. HEP handouts provided. Pt's husband present during session. He will be able to provide needed level of assist a home.    Follow Up Recommendations  Follow surgeon's recommendation for DC plan and follow-up therapies     Equipment Recommendations  None recommended by PT    Recommendations for Other Services       Precautions / Restrictions Precautions Precautions: Knee Precaution Booklet Issued: Yes (comment) Precaution Comments: reviewed precautions Restrictions Weight Bearing Restrictions: Yes RLE Weight Bearing: Weight bearing as tolerated    Mobility  Bed Mobility Overal bed mobility: Needs Assistance Bed Mobility: Supine to Sit;Sit to Supine     Supine to sit: Supervision;HOB elevated Sit to supine: Supervision;HOB elevated   General bed mobility comments: Pt able to use LLE to assist RLE in/out of bed. Supervision for safety and cues.    Transfers Overall transfer level: Needs assistance Equipment used: Rolling walker (2 wheeled) Transfers: Sit to/from Stand Sit to Stand: Min guard Stand pivot transfers: Min guard       General transfer comment: cues for sequencing  Ambulation/Gait Ambulation/Gait assistance: Min guard Gait Distance (Feet): 150 Feet Assistive device: Rolling walker (2 wheeled) Gait Pattern/deviations: Step-through pattern;Decreased stride length Gait velocity: decreased Gait velocity interpretation: 1.31 - 2.62 ft/sec, indicative of limited community ambulator General Gait Details: cues  for sequencing and step-through pattern   Stairs Stairs: Yes Stairs assistance: Min assist Stair Management: One rail Right;Forwards Number of Stairs: 3 General stair comments: cues for sequencing   Wheelchair Mobility    Modified Rankin (Stroke Patients Only)       Balance Overall balance assessment: Needs assistance Sitting-balance support: No upper extremity supported;Feet supported Sitting balance-Leahy Scale: Good     Standing balance support: Bilateral upper extremity supported;During functional activity Standing balance-Leahy Scale: Poor Standing balance comment: Reliant on BUE support                            Cognition Arousal/Alertness: Awake/alert Behavior During Therapy: WFL for tasks assessed/performed Overall Cognitive Status: Within Functional Limits for tasks assessed                                        Exercises Total Joint Exercises Ankle Circles/Pumps: AROM;Both;10 reps;Supine Quad Sets: AROM;10 reps;Supine;Both Towel Squeeze: AROM;Both;5 reps;Supine Short Arc Quad: AROM;Right;5 reps;Supine Heel Slides: AROM;Right;5 reps;Supine Hip ABduction/ADduction: AROM;Right;5 reps;Supine Straight Leg Raises: AROM;Right;5 reps;Supine Goniometric ROM: 0-80 degrees R knee    General Comments General comments (skin integrity, edema, etc.): VSS on RA. Pt reports dizziness is subsiding      Pertinent Vitals/Pain Pain Assessment: No/denies pain Pain Intervention(s): Monitored during session    Home Living Family/patient expects to be discharged to:: Private residence Living Arrangements: Spouse/significant other Available Help at Discharge: Family;Available 24 hours/day Type of Home: House Home Access: Stairs to enter Entrance Stairs-Rails: Can reach both Home Layout: One level Home Equipment: Walker - 2 wheels;Toilet  riser;Bedside commode Additional Comments: Plans to use BSC as shower chair and/or over toilet    Prior  Function Level of Independence: Independent          PT Goals (current goals can now be found in the care plan section) Acute Rehab PT Goals Patient Stated Goal: home Progress towards PT goals: Progressing toward goals    Frequency    7X/week      PT Plan Current plan remains appropriate    Co-evaluation              AM-PAC PT "6 Clicks" Mobility   Outcome Measure  Help needed turning from your back to your side while in a flat bed without using bedrails?: None Help needed moving from lying on your back to sitting on the side of a flat bed without using bedrails?: A Little Help needed moving to and from a bed to a chair (including a wheelchair)?: A Little Help needed standing up from a chair using your arms (e.g., wheelchair or bedside chair)?: A Little Help needed to walk in hospital room?: A Little Help needed climbing 3-5 steps with a railing? : A Little 6 Click Score: 19    End of Session Equipment Utilized During Treatment: Gait belt Activity Tolerance: Patient tolerated treatment well Patient left: in bed;with call bell/phone within reach;with family/visitor present Nurse Communication: Mobility status PT Visit Diagnosis: Pain;Muscle weakness (generalized) (M62.81);Difficulty in walking, not elsewhere classified (R26.2) Pain - Right/Left: Right Pain - part of body: Knee     Time: 7628-3151 PT Time Calculation (min) (ACUTE ONLY): 35 min  Charges:  $Gait Training: 8-22 mins $Therapeutic Exercise: 8-22 mins                     Aida Raider, PT  Office # [phone] Pager [phone]    Ilda Foil 02/19/2021, 9:40 AM

## 2021-02-20 ENCOUNTER — Telehealth: Payer: Self-pay | Admitting: *Deleted

## 2021-02-20 ENCOUNTER — Other Ambulatory Visit: Payer: Self-pay | Admitting: *Deleted

## 2021-02-20 DIAGNOSIS — Z96651 Presence of right artificial knee joint: Secondary | ICD-10-CM

## 2021-02-20 DIAGNOSIS — M1711 Unilateral primary osteoarthritis, right knee: Secondary | ICD-10-CM

## 2021-02-20 NOTE — Telephone Encounter (Signed)
Ortho bundle D/C call completed. 

## 2021-02-25 ENCOUNTER — Telehealth: Payer: Self-pay | Admitting: Orthopaedic Surgery

## 2021-02-25 ENCOUNTER — Telehealth: Payer: Self-pay | Admitting: *Deleted

## 2021-02-25 ENCOUNTER — Other Ambulatory Visit: Payer: Self-pay | Admitting: Physician Assistant

## 2021-02-25 MED ORDER — PROMETHAZINE HCL 25 MG PO TABS: 25.0000 mg | ORAL_TABLET | Freq: Four times a day (QID) | ORAL | 1 refills | Status: DC | PRN

## 2021-02-25 MED ORDER — TIZANIDINE HCL 4 MG PO TABS: 4.0000 mg | ORAL_TABLET | Freq: Two times a day (BID) | ORAL | 1 refills | Status: DC | PRN

## 2021-02-25 MED ORDER — HYDROCODONE-ACETAMINOPHEN 7.5-325 MG PO TABS: 1.0000 | ORAL_TABLET | Freq: Three times a day (TID) | ORAL | 0 refills | Status: DC | PRN

## 2021-02-25 MED ORDER — HYDROCODONE-ACETAMINOPHEN 5-325 MG PO TABS: 1.0000 | ORAL_TABLET | Freq: Four times a day (QID) | ORAL | 0 refills | Status: DC | PRN

## 2021-02-25 NOTE — Telephone Encounter (Signed)
I sent norco and phenergan

## 2021-02-25 NOTE — Telephone Encounter (Signed)
Patient aware.

## 2021-02-25 NOTE — Telephone Encounter (Signed)
Sent in

## 2021-02-25 NOTE — Telephone Encounter (Signed)
So does she want me to try different muscle relaxer and pain meds?

## 2021-02-25 NOTE — Telephone Encounter (Signed)
Patient called again today and states her pain medication (or something) is making her nauseated and very dizzy over the weekend. Zofran hasn't helped. She reports she does have vertigo and some mild tinnitus normally, but it has become worse. She stopped the Methocarbamol to see if that was causing some of the issues, but it has continued. She reports tinnitus is worse and she does have a headache at times. I asked her to take her BP twice daily and keep track of it. She is still having pain, but wanted to see if changing it to something a little different would be helpful for all the other symptoms. Thanks.

## 2021-02-25 NOTE — Telephone Encounter (Signed)
Patient called advised the percocet  and the muscle relaxer made her so sick starting Thursday. Patient stopped taking the medication. Patient asked  is anything else she can take?  The number to contact patient is [phone]

## 2021-02-26 NOTE — Telephone Encounter (Signed)
I spoke to Jodi Mcgrath and I had also sent in norco and zanaflex.  I only did the norco 5, so she was going to cancel my norco 5 and fill your norco 7 I believe

## 2021-03-01 ENCOUNTER — Ambulatory Visit (INDEPENDENT_AMBULATORY_CARE_PROVIDER_SITE_OTHER): Payer: Medicare Other | Admitting: Orthopaedic Surgery

## 2021-03-01 ENCOUNTER — Telehealth: Payer: Self-pay | Admitting: *Deleted

## 2021-03-01 ENCOUNTER — Other Ambulatory Visit: Payer: Self-pay

## 2021-03-01 ENCOUNTER — Encounter: Payer: Self-pay | Admitting: Orthopaedic Surgery

## 2021-03-01 VITALS — Ht 66.0 in | Wt 175.0 lb

## 2021-03-01 DIAGNOSIS — Z96651 Presence of right artificial knee joint: Secondary | ICD-10-CM

## 2021-03-01 MED ORDER — TRAMADOL HCL 50 MG PO TABS: 50.0000 mg | ORAL_TABLET | Freq: Every day | ORAL | 0 refills | Status: DC | PRN

## 2021-03-01 NOTE — Telephone Encounter (Signed)
Ortho bundle 14 day call completed. Patient seen in office during post op appointment with MD.

## 2021-03-02 NOTE — Progress Notes (Signed)
Post-Op Visit Note   Patient: Jodi Mcgrath           Date of Birth: 1956-01-21           MRN: [id number] Visit Date: 03/01/2021 PCP: Billey Co, MD   Assessment & Plan:  Chief Complaint:  Chief Complaint  Patient presents with   Right Knee - Follow-up    Right total knee arthroplasty 02/18/2021   Visit Diagnoses:  1. Status post total right knee replacement     Plan: Elianie is 2 weeks status post right total knee replacement.  Currently taking Percocet for the pain.  She has 1 more session of home health PT remaining as she starts outpatient PT at our office on Monday.  Currently using a walker.  She is having some trouble with pain which is limiting range of motion.  Right knee shows a healed surgical incision.  Sutures intact.  No signs of infection.  Calf is nontender.  Range of motion is appropriate for 2 weeks.  Sutures removed Steri-Strips placed.  Wound care instructions reviewed with the patient today.  Continue aspirin for DVT prophylaxis.  She will begin outpatient PT on Monday.  We will see her back in 4 weeks with two-view x-rays of the right knee.  Follow-Up Instructions: Return in about 4 weeks (around 03/29/2021).   Orders:  No orders of the defined types were placed in this encounter.  Meds ordered this encounter  Medications   traMADol (ULTRAM) 50 MG tablet    Sig: Take 1-2 tablets (50-100 mg total) by mouth daily as needed.    Dispense:  30 tablet    Refill:  0    Imaging: No results found.  PMFS History: Patient Active Problem List   Diagnosis Date Noted   Status post total right knee replacement 02/18/2021   Preoperative evaluation to rule out surgical contraindication 11/23/2020   Episodic recurrent vertigo 10/04/2020   History of kidney stones 10/02/2020   Osteoporosis 10/02/2020   Sensorineural hearing loss 10/02/2020   Osteoarthritis of right knee 06/27/2020   SVT (supraventricular tachycardia) (HCC) 07/26/2019   Overweight (BMI  25.0-29.9) 07/26/2019   H/o Lyme disease    Past Medical History:  Diagnosis Date   Anxiety    Depression    Fibromyalgia    H/o Lyme disease    History of kidney stones    Hypertension    Osteoarthritis    Osteoarthritis of right knee 06/27/2020   Osteoporosis    Overweight (BMI 25.0-29.9) 07/26/2019   Preoperative evaluation to rule out surgical contraindication 11/23/2020   Sensorineural hearing loss 10/02/2020   SVT (supraventricular tachycardia) (HCC)     Family History  Problem Relation Age of Onset   CAD Father        CABG in 21's   Non-Hodgkin's lymphoma Father    CAD Brother        CABG at 23 and SCD at 105   Diabetes Brother    Diabetes Brother     Past Surgical History:  Procedure Laterality Date   ABDOMINAL HYSTERECTOMY     CARDIAC ELECTROPHYSIOLOGY STUDY AND ABLATION     DILATION AND CURETTAGE, DIAGNOSTIC / THERAPEUTIC     KNEE CARTILAGE SURGERY     LITHOTRIPSY     TOTAL KNEE ARTHROPLASTY Right 02/18/2021   Procedure: RIGHT TOTAL KNEE ARTHROPLASTY;  Surgeon: Tarry Kos, MD;  Location: [facility] OR;  Service: Orthopedics;  Laterality: Right;   Social History   Occupational History  Not on file  Tobacco Use   Smoking status: Never   Smokeless tobacco: Never  Vaping Use   Vaping Use: Never used  Substance and Sexual Activity   Alcohol use: Never   Drug use: Never   Sexual activity: Not on file

## 2021-03-04 ENCOUNTER — Ambulatory Visit (INDEPENDENT_AMBULATORY_CARE_PROVIDER_SITE_OTHER): Payer: Medicare Other | Admitting: Physical Therapy

## 2021-03-04 ENCOUNTER — Encounter: Payer: Self-pay | Admitting: Physical Therapy

## 2021-03-04 ENCOUNTER — Other Ambulatory Visit: Payer: Self-pay

## 2021-03-04 DIAGNOSIS — R262 Difficulty in walking, not elsewhere classified: Secondary | ICD-10-CM | POA: Diagnosis not present

## 2021-03-04 DIAGNOSIS — M25661 Stiffness of right knee, not elsewhere classified: Secondary | ICD-10-CM | POA: Diagnosis not present

## 2021-03-04 DIAGNOSIS — R600 Localized edema: Secondary | ICD-10-CM

## 2021-03-04 DIAGNOSIS — M25561 Pain in right knee: Secondary | ICD-10-CM

## 2021-03-04 DIAGNOSIS — M6281 Muscle weakness (generalized): Secondary | ICD-10-CM

## 2021-03-04 NOTE — Patient Instructions (Signed)
Access Code: [email or web address] URL: [email or web address] Date: 03/04/2021 Prepared by: Narda Amber  Exercises Supine Heel Slide - 3-4 x daily - 7 x weekly - 3 sets - 10 reps Seated Heel Slide - 3-4 x daily - 7 x weekly - 3 sets - 10 reps Supine Active Straight Leg Raise - 3-4 x daily - 7 x weekly - 3 sets - 10 reps Long Sitting Quad Set with Towel Roll Under Heel - 3-4 x daily - 7 x weekly - 3 sets - 10 reps - 5 seconds hold Sit to Stand with Counter Support - 3-4 x daily - 7 x weekly - 2 sets - 10 reps Seated Long Arc Quad - 3-4 x daily - 7 x weekly - 3 sets - 10 reps Seated Knee Flexion AAROM - 3-4 x daily - 7 x weekly - 10 reps - 10 seconds hold

## 2021-03-04 NOTE — Therapy (Signed)
[facility] [address] [place], [place], [address] Phone: [phone]   Fax:  [phone]  Physical Therapy Evaluation  Patient Details  Name: Jodi Mcgrath MRN: [id number] Date of Birth: 1956/02/05 Referring Provider (PT): Gershon Mussel MD   Encounter Date: 03/04/2021   PT End of Session - 03/04/21 1525     Visit Number 1    Number of Visits 24    Date for PT Re-Evaluation 05/31/21    Progress Note Due on Visit 10    PT Start Time 1515    PT Stop Time 1600    PT Time Calculation (min) 45 min    Activity Tolerance Patient tolerated treatment well    Behavior During Therapy [facility] for tasks assessed/performed             Past Medical History:  Diagnosis Date   Anxiety    Depression    Fibromyalgia    H/o Lyme disease    History of kidney stones    Hypertension    Osteoarthritis    Osteoarthritis of right knee 06/27/2020   Osteoporosis    Overweight (BMI 25.0-29.9) 07/26/2019   Preoperative evaluation to rule out surgical contraindication 11/23/2020   Sensorineural hearing loss 10/02/2020   SVT (supraventricular tachycardia) (HCC)     Past Surgical History:  Procedure Laterality Date   ABDOMINAL HYSTERECTOMY     CARDIAC ELECTROPHYSIOLOGY STUDY AND ABLATION     DILATION AND CURETTAGE, DIAGNOSTIC / THERAPEUTIC     KNEE CARTILAGE SURGERY     LITHOTRIPSY     TOTAL KNEE ARTHROPLASTY Right 02/18/2021   Procedure: RIGHT TOTAL KNEE ARTHROPLASTY;  Surgeon: Tarry Kos, MD;  Location: [facility] OR;  Service: Orthopedics;  Laterality: Right;    There were no vitals filed for this visit.    Subjective Assessment - 03/04/21 1520     Subjective Pt arriving to therapy s/p right TKA on 02/18/2021. Pt amb with rolling walker. Pt reproting 6 HHPT visits.    Pertinent History anxiety, OA, fibromyalgia, lyme disease, SVT, HTN, kidney stones, Oseoporosis, depression, hearing loss, knee arthroscopy    Diagnostic tests X-ray    Patient Stated  Goals Walk without device, bend my knee    Pain Score 7     Pain Location Knee    Pain Orientation Right    Pain Descriptors / Indicators Sore;Tightness;Sharp    Pain Type Surgical pain    Pain Frequency Constant                [facility] PT Assessment - 03/04/21 0001       Assessment   Medical Diagnosis M17.11 primary OA in right knee, Z96.651 s/o right knee replacement    Referring Provider (PT) Gershon Mussel MD    Onset Date/Surgical Date 02/18/21    Hand Dominance Right    Next MD Visit 4 weeks    Prior Therapy no      Precautions   Precautions None      Restrictions   Weight Bearing Restrictions No      Balance Screen   Has the patient fallen in the past 6 months No    Is the patient reluctant to leave their home because of a fear of falling?  No      Home Environment   Living Environment Private residence    Living Arrangements Spouse/significant other    Type of Home House    Home Access Stairs to enter    Entrance Stairs-Number of Steps  2    Entrance Stairs-Rails Can reach both    Home Layout One level      Prior Function   Level of Independence Independent    Vocation Retired    Leisure be with family and grandchildren      Cognition   Overall Cognitive Status Within Functional Limits for tasks assessed      Observation/Other Assessments   Focus on Therapeutic Outcomes (FOTO)  49% (predicted 66%)      Observation/Other Assessments-Edema    Edema Circumferential      Circumferential Edema   Circumferential - Right 48 centimeters    Circumferential - Left  37 centimters      Posture/Postural Control   Posture/Postural Control Postural limitations    Postural Limitations Rounded Shoulders;Forward head      ROM / Strength   AROM / PROM / Strength AROM;PROM;Strength      AROM   AROM Assessment Site Knee    Right/Left Knee Right;Left    Right Knee Extension -10    Right Knee Flexion 75    Left Knee Extension 0    Left Knee Flexion 130       PROM   PROM Assessment Site Knee    Right/Left Knee Right    Right Knee Extension -8    Right Knee Flexion 80      Strength   Overall Strength Comments bilateral hips grossly 5/5    Strength Assessment Site Knee    Right/Left Knee Right;Left    Right Knee Flexion 3/5    Right Knee Extension 3/5    Left Knee Flexion 5/5    Left Knee Extension 5/5      Palpation   Palpation comment TTP along medial and lateral joint line, around incision site, steri strips intact and wound appears to be healing nicely      Transfers   Five time sit to stand comments  25 seconds with UE support      Ambulation/Gait   Gait Comments amb with rolling walker with step to gait pattern.                        Objective measurements completed on examination: See above findings.               PT Education - 03/04/21 1523     Education Details PT POC, HEP    Person(s) Educated Patient    Methods Explanation;Demonstration;Tactile cues;Verbal cues;Handout    Comprehension Verbalized understanding              PT Short Term Goals - 03/04/21 1526       PT SHORT TERM GOAL #1   Title Pt will be independent in her intial HEP.    Time 4    Period Weeks    Status New    Target Date 04/05/21      PT SHORT TERM GOAL #2   Title Pt will improve her 5 time sit to stand to </= 14 seconds with no UE support    Time 6    Period Weeks    Status New    Target Date 04/19/21               PT Long Term Goals - 03/04/21 1623       PT LONG TERM GOAL #1   Title Pt will be independent in her advanced HEP.    Time 12    Period Weeks  Status New    Target Date 05/31/21      PT LONG TERM GOAL #2   Title Pt will be able to amb community distances with no device with pain </= 2/10 in right knee.    Time 12    Period Weeks    Status New    Target Date 05/31/21      PT LONG TERM GOAL #3   Title Pt will improve her right knee extension/ flexion arc to 0-120 degrees.     Time 12    Period Weeks    Status New    Target Date 05/31/21      PT LONG TERM GOAL #4   Title Pt will be able to amb up and down 1 flight of stairs with single hand rail with pain </= 2/10.    Time 12    Period Weeks    Status New    Target Date 05/31/21      PT LONG TERM GOAL #5   Title Pt will improve her FOTO to >/= 66%.    Baseline 49% on 03/04/2021    Time 12    Period Weeks    Status New    Target Date 05/31/21                    Plan - 03/04/21 1606     Clinical Impression Statement Pt arriving to therapy today s/p right total knee replacement on 02/18/2021. Pt reporting pain of 7/10. Pt stating she has a CPM at home and has increased her knee flexion to around 82 degrees. Pt was insructed to raise the CPM to 90 degrees or more as tolerated. Pt instructed in HEP for ROM, strenthening and functional mobility. Pt presenting today with limited flexion/extension and strength. Pt currently amb with a rolling walker with step to gait pattern. Skilled PT needed to address pt's impairments with the below interventions.    Personal Factors and Comorbidities Comorbidity 3+    Comorbidities anxiety, OA, fibromyalgia, lyme disease, SVT, HTN, kidney stones, Oseoporosis, depression, hearing loss, knee arthroscopy    Examination-Activity Limitations Stairs;Squat;Dressing;Lift;Transfers;Bend;Stand    Examination-Participation Restrictions Community Activity;Other    Stability/Clinical Decision Making Stable/Uncomplicated    Clinical Decision Making Low    Rehab Potential Good    PT Frequency 2x / week    PT Duration 12 weeks    PT Treatment/Interventions ADLs/Self Care Home Management;Electrical Stimulation;Moist Heat;Balance training;Therapeutic exercise;Therapeutic activities;Functional mobility training;Stair training;Gait training;Neuromuscular re-education;Cognitive remediation;Patient/family education;Wheelchair mobility training;Manual techniques;Passive range of  motion;Taping;Ultrasound    PT Next Visit Plan Nustep, LE ROM and strengthening, gait training    PT Home Exercise Plan Access Code: [email or web address]  URL: [email or web address]  Date: 03/04/2021  Prepared by: Narda Amber    Exercises  Supine Heel Slide - 3-4 x daily - 7 x weekly - 3 sets - 10 reps  Seated Heel Slide - 3-4 x daily - 7 x weekly - 3 sets - 10 reps  Supine Active Straight Leg Raise - 3-4 x daily - 7 x weekly - 3 sets - 10 reps  Long Sitting Quad Set with Towel Roll Under Heel - 3-4 x daily - 7 x weekly - 3 sets - 10 reps - 5 seconds hold  Sit to Stand with Counter Support - 3-4 x daily - 7 x weekly - 2 sets - 10 reps  Seated Long Arc Quad - 3-4 x daily - 7 x weekly - 3 sets - 10 reps  Seated  Knee Flexion AAROM - 3-4 x daily - 7 x weekly - 10 reps - 10 seconds hold    Consulted and Agree with Plan of Care Patient             Patient will benefit from skilled therapeutic intervention in order to improve the following deficits and impairments:  Pain, Postural dysfunction, Decreased strength, Decreased mobility, Increased edema, Decreased balance, Impaired flexibility, Difficulty walking, Decreased range of motion, Decreased activity tolerance  Visit Diagnosis: Difficulty in walking, not elsewhere classified - Plan: PT plan of care cert/re-cert  Acute pain of right knee - Plan: PT plan of care cert/re-cert  Muscle weakness (generalized) - Plan: PT plan of care cert/re-cert  Stiffness of right knee, not elsewhere classified - Plan: PT plan of care cert/re-cert  Localized edema - Plan: PT plan of care cert/re-cert     Problem List Patient Active Problem List   Diagnosis Date Noted   Status post total right knee replacement 02/18/2021   Preoperative evaluation to rule out surgical contraindication 11/23/2020   Episodic recurrent vertigo 10/04/2020   History of kidney stones 10/02/2020   Osteoporosis 10/02/2020   Sensorineural hearing loss 10/02/2020    Osteoarthritis of right knee 06/27/2020   SVT (supraventricular tachycardia) (HCC) 07/26/2019   Overweight (BMI 25.0-29.9) 07/26/2019   H/o Lyme disease     Sharmon Leyden, PT, MPT 03/04/2021, 4:35 PM  [facility] [address] [place], [place], [address] Phone: [phone]   Fax:  [phone]  Name: Jodi Mcgrath MRN: [id number] Date of Birth: 04-07-1956

## 2021-03-05 ENCOUNTER — Encounter: Payer: Medicare Other | Admitting: Orthopaedic Surgery

## 2021-03-07 ENCOUNTER — Ambulatory Visit (INDEPENDENT_AMBULATORY_CARE_PROVIDER_SITE_OTHER): Payer: Medicare Other | Admitting: Rehabilitative and Restorative Service Providers"

## 2021-03-07 ENCOUNTER — Encounter: Payer: Self-pay | Admitting: Rehabilitative and Restorative Service Providers"

## 2021-03-07 ENCOUNTER — Other Ambulatory Visit: Payer: Self-pay

## 2021-03-07 DIAGNOSIS — R262 Difficulty in walking, not elsewhere classified: Secondary | ICD-10-CM | POA: Diagnosis not present

## 2021-03-07 DIAGNOSIS — M6281 Muscle weakness (generalized): Secondary | ICD-10-CM

## 2021-03-07 DIAGNOSIS — M25661 Stiffness of right knee, not elsewhere classified: Secondary | ICD-10-CM | POA: Diagnosis not present

## 2021-03-07 DIAGNOSIS — R600 Localized edema: Secondary | ICD-10-CM | POA: Diagnosis not present

## 2021-03-07 DIAGNOSIS — M25561 Pain in right knee: Secondary | ICD-10-CM

## 2021-03-07 NOTE — Therapy (Signed)
[facility] [address] [place], [place], [address] Phone: [phone]   Fax:  [phone]  Physical Therapy Treatment  Patient Details  Name: Jodi Mcgrath MRN: [id number] Date of Birth: 1956-03-15 Referring Provider (PT): Gershon Mussel MD   Encounter Date: 03/07/2021   PT End of Session - 03/07/21 0926     Visit Number 2    Number of Visits 24    Date for PT Re-Evaluation 05/31/21    Progress Note Due on Visit 10    PT Start Time 0845    PT Stop Time 0935    PT Time Calculation (min) 50 min    Activity Tolerance Patient tolerated treatment well    Behavior During Therapy [facility] for tasks assessed/performed             Past Medical History:  Diagnosis Date   Anxiety    Depression    Fibromyalgia    H/o Lyme disease    History of kidney stones    Hypertension    Osteoarthritis    Osteoarthritis of right knee 06/27/2020   Osteoporosis    Overweight (BMI 25.0-29.9) 07/26/2019   Preoperative evaluation to rule out surgical contraindication 11/23/2020   Sensorineural hearing loss 10/02/2020   SVT (supraventricular tachycardia) (HCC)     Past Surgical History:  Procedure Laterality Date   ABDOMINAL HYSTERECTOMY     CARDIAC ELECTROPHYSIOLOGY STUDY AND ABLATION     DILATION AND CURETTAGE, DIAGNOSTIC / THERAPEUTIC     KNEE CARTILAGE SURGERY     LITHOTRIPSY     TOTAL KNEE ARTHROPLASTY Right 02/18/2021   Procedure: RIGHT TOTAL KNEE ARTHROPLASTY;  Surgeon: Tarry Kos, MD;  Location: [facility] OR;  Service: Orthopedics;  Laterality: Right;    There were no vitals filed for this visit.   Subjective Assessment - 03/07/21 0912     Subjective Jodi Mcgrath reports good early HEP compliance.  She is able to get "decent" sleep with pain medication.    Pertinent History anxiety, OA, fibromyalgia, lyme disease, SVT, HTN, kidney stones, Oseoporosis, depression, hearing loss, knee arthroscopy    Limitations Standing;Walking;House hold activities     How long can you sit comfortably? 45 minutes    How long can you stand comfortably? 20 minutes    How long can you walk comfortably? Maybe 5 minutes    Diagnostic tests X-ray    Patient Stated Goals Walk without device, bend my knee    Currently in Pain? Yes    Pain Score 6     Pain Location Knee    Pain Orientation Right    Pain Descriptors / Indicators Tightness    Pain Type Surgical pain;Chronic pain    Pain Radiating Towards NA    Pain Onset 1 to 4 weeks ago    Pain Frequency Constant    Aggravating Factors  Staying in one position too long or WB too long    Pain Relieving Factors Tramadol, extra strength tylenol, ice    Effect of Pain on Daily Activities Uses a walker and limited WB endurance    Multiple Pain Sites No                [facility] PT Assessment - 03/07/21 0001       PROM   PROM Assessment Site Knee    Right/Left Knee Right    Right Knee Extension -7    Right Knee Flexion 87  [facility] Adult PT Treatment/Exercise - 03/07/21 0001       Exercises   Exercises Knee/Hip      Knee/Hip Exercises: Stretches   Other Knee/Hip Stretches Tailgate knee flexion AROM 3 minutes    Other Knee/Hip Stretches Seated knee flexion AAROM (L pushes R into flexion) 10X 10 seconds and supine knee flexion with belt 10X 10 seconds      Knee/Hip Exercises: Aerobic   Recumbent Bike Seat 9 for 10 minutes AAROM (back and forth with stretch into flexion and extension)      Knee/Hip Exercises: Supine   Quad Sets Strengthening;Both;3 sets;10 reps;Limitations    Quad Sets Limitations 5 seconds (toes back, press knees down and tighten thighs)                    PT Education - 03/07/21 0924     Education Details Talked about day 1 HEP and reviewed exercises completed today.    Person(s) Educated Patient    Methods Explanation;Demonstration;Tactile cues;Verbal cues;Handout    Comprehension Verbal cues required;Returned  demonstration;Need further instruction;Verbalized understanding;Tactile cues required              PT Short Term Goals - 03/04/21 1526       PT SHORT TERM GOAL #1   Title Pt will be independent in her intial HEP.    Time 4    Period Weeks    Status New    Target Date 04/05/21      PT SHORT TERM GOAL #2   Title Pt will improve her 5 time sit to stand to </= 14 seconds with no UE support    Time 6    Period Weeks    Status New    Target Date 04/19/21               PT Long Term Goals - 03/04/21 1623       PT LONG TERM GOAL #1   Title Pt will be independent in her advanced HEP.    Time 12    Period Weeks    Status New    Target Date 05/31/21      PT LONG TERM GOAL #2   Title Pt will be able to amb community distances with no device with pain </= 2/10 in right knee.    Time 12    Period Weeks    Status New    Target Date 05/31/21      PT LONG TERM GOAL #3   Title Pt will improve her right knee extension/ flexion arc to 0-120 degrees.    Time 12    Period Weeks    Status New    Target Date 05/31/21      PT LONG TERM GOAL #4   Title Pt will be able to amb up and down 1 flight of stairs with single hand rail with pain </= 2/10.    Time 12    Period Weeks    Status New    Target Date 05/31/21      PT LONG TERM GOAL #5   Title Pt will improve her FOTO to >/= 66%.    Baseline 49% on 03/04/2021    Time 12    Period Weeks    Status New    Target Date 05/31/21                   Plan - 03/07/21 1027     Clinical Impression Statement Jodi Mcgrath reports  good early HEP compliance.  Both flexion and extension AROM are in need of work along with quadriceps strength and edema control.  Continue POC to meet LTGs < 3 weeks s/p TKA.    Personal Factors and Comorbidities Comorbidity 3+    Comorbidities anxiety, OA, fibromyalgia, lyme disease, SVT, HTN, kidney stones, Oseoporosis, depression, hearing loss, knee arthroscopy    Examination-Activity Limitations  Stairs;Squat;Dressing;Lift;Transfers;Bend;Stand    Examination-Participation Restrictions Community Activity;Other    Stability/Clinical Decision Making Stable/Uncomplicated    Rehab Potential Good    PT Frequency 2x / week    PT Duration 12 weeks    PT Treatment/Interventions ADLs/Self Care Home Management;Electrical Stimulation;Moist Heat;Balance training;Therapeutic exercise;Therapeutic activities;Functional mobility training;Stair training;Gait training;Neuromuscular re-education;Cognitive remediation;Patient/family education;Wheelchair mobility training;Manual techniques;Passive range of motion;Taping;Ultrasound    PT Next Visit Plan AROM, quadriceps strength, edema control    PT Home Exercise Plan Access Code: [email or web address]  URL: [email or web address]  Date: 03/04/2021  Prepared by: Narda Amber    Exercises  Supine Heel Slide - 3-4 x daily - 7 x weekly - 3 sets - 10 reps  Seated Heel Slide - 3-4 x daily - 7 x weekly - 3 sets - 10 reps  Supine Active Straight Leg Raise - 3-4 x daily - 7 x weekly - 3 sets - 10 reps  Long Sitting Quad Set with Towel Roll Under Heel - 3-4 x daily - 7 x weekly - 3 sets - 10 reps - 5 seconds hold  Sit to Stand with Counter Support - 3-4 x daily - 7 x weekly - 2 sets - 10 reps  Seated Long Arc Quad - 3-4 x daily - 7 x weekly - 3 sets - 10 reps  Seated Knee Flexion AAROM - 3-4 x daily - 7 x weekly - 10 reps - 10 seconds hold    Consulted and Agree with Plan of Care Patient             Patient will benefit from skilled therapeutic intervention in order to improve the following deficits and impairments:  Pain, Postural dysfunction, Decreased strength, Decreased mobility, Increased edema, Decreased balance, Impaired flexibility, Difficulty walking, Decreased range of motion, Decreased activity tolerance  Visit Diagnosis: Difficulty in walking, not elsewhere classified  Muscle weakness (generalized)  Localized edema  Stiffness of right knee, not  elsewhere classified  Acute pain of right knee     Problem List Patient Active Problem List   Diagnosis Date Noted   Status post total right knee replacement 02/18/2021   Preoperative evaluation to rule out surgical contraindication 11/23/2020   Episodic recurrent vertigo 10/04/2020   History of kidney stones 10/02/2020   Osteoporosis 10/02/2020   Sensorineural hearing loss 10/02/2020   Osteoarthritis of right knee 06/27/2020   SVT (supraventricular tachycardia) (HCC) 07/26/2019   Overweight (BMI 25.0-29.9) 07/26/2019   H/o Lyme disease     Cherlyn Cushing PT, MPT 03/07/2021, 10:29 AM  [facility] Physical Therapy [address] [place], [place], [address] Phone: [phone]   Fax:  [phone]  Name: Jodi Mcgrath MRN: [id number] Date of Birth: Apr 18, 1956

## 2021-03-07 NOTE — Patient Instructions (Signed)
Access Code: [email or web address] URL: [email or web address] Date: 03/07/2021 Prepared by: Pauletta Browns  Exercises Supine Quadricep Sets - 2-3 x daily - 7 x weekly - 2-3 sets - 10 reps - 5 second hold Seated Knee Flexion AAROM - 3-5 x daily - 7 x weekly - 1 sets - 10 reps - 10 seconds hold

## 2021-03-12 ENCOUNTER — Ambulatory Visit: Payer: Medicare Other | Admitting: Endocrinology

## 2021-03-13 ENCOUNTER — Other Ambulatory Visit: Payer: Self-pay

## 2021-03-13 ENCOUNTER — Encounter: Payer: Self-pay | Admitting: Physical Therapy

## 2021-03-13 ENCOUNTER — Ambulatory Visit (INDEPENDENT_AMBULATORY_CARE_PROVIDER_SITE_OTHER): Payer: Medicare Other | Admitting: Physical Therapy

## 2021-03-13 DIAGNOSIS — M25661 Stiffness of right knee, not elsewhere classified: Secondary | ICD-10-CM

## 2021-03-13 DIAGNOSIS — M25561 Pain in right knee: Secondary | ICD-10-CM

## 2021-03-13 DIAGNOSIS — R600 Localized edema: Secondary | ICD-10-CM

## 2021-03-13 DIAGNOSIS — M6281 Muscle weakness (generalized): Secondary | ICD-10-CM

## 2021-03-13 DIAGNOSIS — R262 Difficulty in walking, not elsewhere classified: Secondary | ICD-10-CM | POA: Diagnosis not present

## 2021-03-13 NOTE — Therapy (Signed)
[facility] Physical Therapy [address] [place], [place], [address] Phone: [phone]   Fax:  [phone]  Physical Therapy Treatment  Patient Details  Name: EZABELLA TESKA MRN: [id number] Date of Birth: 06/11/1956 Referring Provider (PT): Gershon Mussel MD   Encounter Date: 03/13/2021   PT End of Session - 03/13/21 1031     Visit Number 3    Number of Visits 24    Date for PT Re-Evaluation 05/31/21    Progress Note Due on Visit 10    PT Start Time 1018    PT Stop Time 1103    PT Time Calculation (min) 45 min    Activity Tolerance Patient tolerated treatment well    Behavior During Therapy [facility] for tasks assessed/performed             Past Medical History:  Diagnosis Date   Anxiety    Depression    Fibromyalgia    H/o Lyme disease    History of kidney stones    Hypertension    Osteoarthritis    Osteoarthritis of right knee 06/27/2020   Osteoporosis    Overweight (BMI 25.0-29.9) 07/26/2019   Preoperative evaluation to rule out surgical contraindication 11/23/2020   Sensorineural hearing loss 10/02/2020   SVT (supraventricular tachycardia) (HCC)     Past Surgical History:  Procedure Laterality Date   ABDOMINAL HYSTERECTOMY     CARDIAC ELECTROPHYSIOLOGY STUDY AND ABLATION     DILATION AND CURETTAGE, DIAGNOSTIC / THERAPEUTIC     KNEE CARTILAGE SURGERY     LITHOTRIPSY     TOTAL KNEE ARTHROPLASTY Right 02/18/2021   Procedure: RIGHT TOTAL KNEE ARTHROPLASTY;  Surgeon: Tarry Kos, MD;  Location: [facility] OR;  Service: Orthopedics;  Laterality: Right;    There were no vitals filed for this visit.   Subjective Assessment - 03/13/21 1029     Subjective Pt arriving today reporting 6/10 pain/stiffness in her right knee.    Pertinent History anxiety, OA, fibromyalgia, lyme disease, SVT, HTN, kidney stones, Oseoporosis, depression, hearing loss, knee arthroscopy    Diagnostic tests X-ray    Patient Stated Goals Walk without device, bend my knee     Currently in Pain? Yes    Pain Score 6     Pain Location Knee    Pain Orientation Right    Pain Descriptors / Indicators Tightness;Other (Comment)    Pain Type Surgical pain    Pain Onset 1 to 4 weeks ago                [facility] PT Assessment - 03/13/21 0001       Assessment   Medical Diagnosis M17.11    Referring Provider (PT) Gershon Mussel MD    Onset Date/Surgical Date 02/18/21      AROM   Right Knee Extension -8    Right Knee Flexion 86      PROM   PROM Assessment Site Knee    Right/Left Knee Right    Right Knee Extension -6    Right Knee Flexion 90                           [facility] Adult PT Treatment/Exercise - 03/13/21 0001       Exercises   Exercises Knee/Hip      Knee/Hip Exercises: Aerobic   Recumbent Bike bike/UBE only used bike seat at 12 x 10 minutes, able to perform full revolution after a few minutes  Knee/Hip Exercises: Machines for Strengthening   Cybex Leg Press bilateral LE's 75#2x15, Rt LE only: 37# 2x15      Knee/Hip Exercises: Standing   Heel Raises Both;15 reps    Forward Step Up Right;15 reps    Forward Step Up Limitations UE support    Step Down Right;15 reps    Step Down Limitations UE support      Knee/Hip Exercises: Seated   Long Arc Quad Strengthening;Right;15 reps      Knee/Hip Exercises: Supine   Quad Sets Strengthening;Both;3 sets;10 reps;Limitations    Quad Sets Limitations 5 seconds    Straight Leg Raises Strengthening;Right;2 sets;10 reps      Manual Therapy   Manual therapy comments PROM flexion and extension right knee                      PT Short Term Goals - 03/13/21 1044       PT SHORT TERM GOAL #1   Title Pt will be independent in her intial HEP.    Status On-going      PT SHORT TERM GOAL #2   Title Pt will improve her 5 time sit to stand to </= 14 seconds with no UE support    Status On-going               PT Long Term Goals - 03/04/21 1623       PT LONG TERM GOAL  #1   Title Pt will be independent in her advanced HEP.    Time 12    Period Weeks    Status New    Target Date 05/31/21      PT LONG TERM GOAL #2   Title Pt will be able to amb community distances with no device with pain </= 2/10 in right knee.    Time 12    Period Weeks    Status New    Target Date 05/31/21      PT LONG TERM GOAL #3   Title Pt will improve her right knee extension/ flexion arc to 0-120 degrees.    Time 12    Period Weeks    Status New    Target Date 05/31/21      PT LONG TERM GOAL #4   Title Pt will be able to amb up and down 1 flight of stairs with single hand rail with pain </= 2/10.    Time 12    Period Weeks    Status New    Target Date 05/31/21      PT LONG TERM GOAL #5   Title Pt will improve her FOTO to >/= 66%.    Baseline 49% on 03/04/2021    Time 12    Period Weeks    Status New    Target Date 05/31/21                   Plan - 03/13/21 1032     Clinical Impression Statement Pt arriving today reporting 3/10 pain in right knee. Pt progressing with more functional mobility during today's treatment with step ups, and sit to stand. Continue to progress with overall AROM/PROM and strengthening. Pt was edu in prone knee hangs and soft tissue quad and IT band massage to help with edema and stiffness. Pt also instructed to begin amb without walker at home short distance on level surfaces. Pt instructed to purchase a straight cane to progress here in therapy for communtiy amb. Continue  skilled PT.    Personal Factors and Comorbidities Comorbidity 3+    Comorbidities anxiety, OA, fibromyalgia, lyme disease, SVT, HTN, kidney stones, Oseoporosis, depression, hearing loss, knee arthroscopy    Examination-Activity Limitations Stairs;Squat;Dressing;Lift;Transfers;Bend;Stand    Examination-Participation Restrictions Community Activity;Other    Stability/Clinical Decision Making Stable/Uncomplicated    Rehab Potential Good    PT Frequency 2x / week     PT Duration 12 weeks    PT Treatment/Interventions ADLs/Self Care Home Management;Electrical Stimulation;Moist Heat;Balance training;Therapeutic exercise;Therapeutic activities;Functional mobility training;Stair training;Gait training;Neuromuscular re-education;Cognitive remediation;Patient/family education;Wheelchair mobility training;Manual techniques;Passive range of motion;Taping;Ultrasound    PT Next Visit Plan AROM, quadriceps strength, edema control, steps, gait training, and sit to stand    PT Home Exercise Plan Access Code: [email or web address]  URL: [email or web address]  Date: 03/04/2021  Prepared by: Narda Amber    Exercises  Supine Heel Slide - 3-4 x daily - 7 x weekly - 3 sets - 10 reps  Seated Heel Slide - 3-4 x daily - 7 x weekly - 3 sets - 10 reps  Supine Active Straight Leg Raise - 3-4 x daily - 7 x weekly - 3 sets - 10 reps  Long Sitting Quad Set with Towel Roll Under Heel - 3-4 x daily - 7 x weekly - 3 sets - 10 reps - 5 seconds hold  Sit to Stand with Counter Support - 3-4 x daily - 7 x weekly - 2 sets - 10 reps  Seated Long Arc Quad - 3-4 x daily - 7 x weekly - 3 sets - 10 reps  Seated Knee Flexion AAROM - 3-4 x daily - 7 x weekly - 10 reps - 10 seconds hold    Consulted and Agree with Plan of Care Patient             Patient will benefit from skilled therapeutic intervention in order to improve the following deficits and impairments:  Pain, Postural dysfunction, Decreased strength, Decreased mobility, Increased edema, Decreased balance, Impaired flexibility, Difficulty walking, Decreased range of motion, Decreased activity tolerance  Visit Diagnosis: Difficulty in walking, not elsewhere classified  Muscle weakness (generalized)  Localized edema  Stiffness of right knee, not elsewhere classified  Acute pain of right knee     Problem List Patient Active Problem List   Diagnosis Date Noted   Status post total right knee replacement 02/18/2021    Preoperative evaluation to rule out surgical contraindication 11/23/2020   Episodic recurrent vertigo 10/04/2020   History of kidney stones 10/02/2020   Osteoporosis 10/02/2020   Sensorineural hearing loss 10/02/2020   Osteoarthritis of right knee 06/27/2020   SVT (supraventricular tachycardia) (HCC) 07/26/2019   Overweight (BMI 25.0-29.9) 07/26/2019   H/o Lyme disease     Sharmon Leyden, PT, MPT 03/13/2021, 11:18 AM  [facility] Physical Therapy [address] [place], [place], [address] Phone: [phone]   Fax:  [phone]  Name: MARLA POULIOT MRN: [id number] Date of Birth: 1956-03-16

## 2021-03-14 ENCOUNTER — Telehealth: Payer: Self-pay | Admitting: Orthopaedic Surgery

## 2021-03-14 NOTE — Telephone Encounter (Signed)
Pt calling to see if she can get a refill on her Tramadol prescription. The pharmacy on file is the best one and the best call back number is [phone].

## 2021-03-15 ENCOUNTER — Other Ambulatory Visit: Payer: Self-pay | Admitting: Physician Assistant

## 2021-03-15 MED ORDER — TRAMADOL HCL 50 MG PO TABS: 50.0000 mg | ORAL_TABLET | Freq: Every day | ORAL | 2 refills | Status: DC | PRN

## 2021-03-15 NOTE — Telephone Encounter (Signed)
Sent in

## 2021-03-18 ENCOUNTER — Other Ambulatory Visit: Payer: Self-pay

## 2021-03-18 ENCOUNTER — Encounter: Payer: Self-pay | Admitting: Physical Therapy

## 2021-03-18 ENCOUNTER — Ambulatory Visit (INDEPENDENT_AMBULATORY_CARE_PROVIDER_SITE_OTHER): Payer: Medicare Other | Admitting: Physical Therapy

## 2021-03-18 DIAGNOSIS — M25661 Stiffness of right knee, not elsewhere classified: Secondary | ICD-10-CM | POA: Diagnosis not present

## 2021-03-18 DIAGNOSIS — R600 Localized edema: Secondary | ICD-10-CM

## 2021-03-18 DIAGNOSIS — M6281 Muscle weakness (generalized): Secondary | ICD-10-CM

## 2021-03-18 DIAGNOSIS — R262 Difficulty in walking, not elsewhere classified: Secondary | ICD-10-CM

## 2021-03-18 DIAGNOSIS — M25561 Pain in right knee: Secondary | ICD-10-CM

## 2021-03-18 NOTE — Therapy (Signed)
[facility] [address] [place], [place], [address] Phone: [phone]   Fax:  [phone]  Physical Therapy Treatment  Patient Details  Name: Jodi Mcgrath MRN: [id number] Date of Birth: 12/22/1955 Referring Provider (PT): Gershon Mussel MD   Encounter Date: 03/18/2021   PT End of Session - 03/18/21 1146     Visit Number 4    Number of Visits 24    Date for PT Re-Evaluation 05/31/21    Progress Note Due on Visit 10    PT Start Time 1015    PT Stop Time 1058    PT Time Calculation (min) 43 min    Activity Tolerance Patient tolerated treatment well    Behavior During Therapy [facility] for tasks assessed/performed             Past Medical History:  Diagnosis Date   Anxiety    Depression    Fibromyalgia    H/o Lyme disease    History of kidney stones    Hypertension    Osteoarthritis    Osteoarthritis of right knee 06/27/2020   Osteoporosis    Overweight (BMI 25.0-29.9) 07/26/2019   Preoperative evaluation to rule out surgical contraindication 11/23/2020   Sensorineural hearing loss 10/02/2020   SVT (supraventricular tachycardia) (HCC)     Past Surgical History:  Procedure Laterality Date   ABDOMINAL HYSTERECTOMY     CARDIAC ELECTROPHYSIOLOGY STUDY AND ABLATION     DILATION AND CURETTAGE, DIAGNOSTIC / THERAPEUTIC     KNEE CARTILAGE SURGERY     LITHOTRIPSY     TOTAL KNEE ARTHROPLASTY Right 02/18/2021   Procedure: RIGHT TOTAL KNEE ARTHROPLASTY;  Surgeon: Tarry Kos, MD;  Location: [facility] OR;  Service: Orthopedics;  Laterality: Right;    There were no vitals filed for this visit.   Subjective Assessment - 03/18/21 1144     Subjective Pt arriving reporting 5/10 pain in her right knee. Pt reporting more stiffness. Pt stating she hasn't purchased a cane yet, but will do so today. Pt amb with rolling walker.    Limitations Standing;Walking;House hold activities    Diagnostic tests X-ray    Patient Stated Goals Walk without device,  bend my knee    Currently in Pain? Yes    Pain Score 5     Pain Location Knee    Pain Orientation Right    Pain Descriptors / Indicators Sore;Tightness;Aching    Pain Type Surgical pain    Pain Onset More than a month ago    Pain Frequency Constant                [facility] PT Assessment - 03/18/21 0001       Assessment   Medical Diagnosis M17.11 primary OA in left knee   Left TKA   Referring Provider (PT) Gershon Mussel MD    Onset Date/Surgical Date 02/18/21      AROM   Right Knee Extension -8    Right Knee Flexion 92      PROM   Right/Left Knee Right    Right Knee Extension -5    Right Knee Flexion 95                           [facility] Adult PT Treatment/Exercise - 03/18/21 0001       Exercises   Exercises Knee/Hip      Knee/Hip Exercises: Aerobic   Recumbent Bike bike seat att 8, partial to then full revolutions  x 8 minutes      Knee/Hip Exercises: Machines for Strengthening   Cybex Leg Press bilateral LE's 75#2x15, Rt LE only: 37# 2x15      Knee/Hip Exercises: Standing   Heel Raises Both;15 reps      Knee/Hip Exercises: Seated   Long Arc Quad Strengthening;Right;2 sets;10 reps    Long Arc Quad Weight 2 lbs.      Knee/Hip Exercises: Supine   Quad Sets Strengthening;Both;3 sets;10 reps;Limitations    Quad Sets Limitations 5 seconds, heel on yellow ball    Straight Leg Raises Strengthening;Right;2 sets;10 reps      Manual Therapy   Manual therapy comments PROM flexion and extension right knee                      PT Short Term Goals - 03/18/21 1149       PT SHORT TERM GOAL #1   Title Pt will be independent in her intial HEP.    Status On-going      PT SHORT TERM GOAL #2   Title Pt will improve her 5 time sit to stand to </= 14 seconds with no UE support    Status On-going               PT Long Term Goals - 03/04/21 1623       PT LONG TERM GOAL #1   Title Pt will be independent in her advanced HEP.    Time 12     Period Weeks    Status New    Target Date 05/31/21      PT LONG TERM GOAL #2   Title Pt will be able to amb community distances with no device with pain </= 2/10 in right knee.    Time 12    Period Weeks    Status New    Target Date 05/31/21      PT LONG TERM GOAL #3   Title Pt will improve her right knee extension/ flexion arc to 0-120 degrees.    Time 12    Period Weeks    Status New    Target Date 05/31/21      PT LONG TERM GOAL #4   Title Pt will be able to amb up and down 1 flight of stairs with single hand rail with pain </= 2/10.    Time 12    Period Weeks    Status New    Target Date 05/31/21      PT LONG TERM GOAL #5   Title Pt will improve her FOTO to >/= 66%.    Baseline 49% on 03/04/2021    Time 12    Period Weeks    Status New    Target Date 05/31/21                   Plan - 03/18/21 1146     Clinical Impression Statement Pt reporting 4-5/10 pain in right knee. Pt stating therer is more stiffness but also reporting being more active with no episodes of knee buckeling. Pt reporting the self massage seems to be helping along with updated HEP. Performed gait training with straight cane and pt needed initial cues for sequencing but able to return demonstration with heel to toe gait. Pt to purchase st cane later today. Continue skilled PT to maximize function.    Personal Factors and Comorbidities Comorbidity 3+    Comorbidities anxiety, OA, fibromyalgia, lyme disease, SVT, HTN, kidney  stones, Oseoporosis, depression, hearing loss, knee arthroscopy    Examination-Activity Limitations Stairs;Squat;Dressing;Lift;Transfers;Bend;Stand    Examination-Participation Restrictions Community Activity;Other    Stability/Clinical Decision Making Stable/Uncomplicated    Rehab Potential Good    PT Frequency 2x / week    PT Duration 12 weeks    PT Treatment/Interventions ADLs/Self Care Home Management;Electrical Stimulation;Moist Heat;Balance training;Therapeutic  exercise;Therapeutic activities;Functional mobility training;Stair training;Gait training;Neuromuscular re-education;Cognitive remediation;Patient/family education;Wheelchair mobility training;Manual techniques;Passive range of motion;Taping;Ultrasound    PT Next Visit Plan AROM, quadriceps strength, steps, gait training, and sit to stand    PT Home Exercise Plan Access Code: [email or web address]  URL: [email or web address]  Date: 03/04/2021  Prepared by: Narda Amber    Exercises  Supine Heel Slide - 3-4 x daily - 7 x weekly - 3 sets - 10 reps  Seated Heel Slide - 3-4 x daily - 7 x weekly - 3 sets - 10 reps  Supine Active Straight Leg Raise - 3-4 x daily - 7 x weekly - 3 sets - 10 reps  Long Sitting Quad Set with Towel Roll Under Heel - 3-4 x daily - 7 x weekly - 3 sets - 10 reps - 5 seconds hold  Sit to Stand with Counter Support - 3-4 x daily - 7 x weekly - 2 sets - 10 reps  Seated Long Arc Quad - 3-4 x daily - 7 x weekly - 3 sets - 10 reps  Seated Knee Flexion AAROM - 3-4 x daily - 7 x weekly - 10 reps - 10 seconds hold    Consulted and Agree with Plan of Care Patient             Patient will benefit from skilled therapeutic intervention in order to improve the following deficits and impairments:  Pain, Postural dysfunction, Decreased strength, Decreased mobility, Increased edema, Decreased balance, Impaired flexibility, Difficulty walking, Decreased range of motion, Decreased activity tolerance  Visit Diagnosis: Difficulty in walking, not elsewhere classified  Muscle weakness (generalized)  Localized edema  Stiffness of right knee, not elsewhere classified  Acute pain of right knee     Problem List Patient Active Problem List   Diagnosis Date Noted   Status post total right knee replacement 02/18/2021   Preoperative evaluation to rule out surgical contraindication 11/23/2020   Episodic recurrent vertigo 10/04/2020   History of kidney stones 10/02/2020   Osteoporosis  10/02/2020   Sensorineural hearing loss 10/02/2020   Osteoarthritis of right knee 06/27/2020   SVT (supraventricular tachycardia) (HCC) 07/26/2019   Overweight (BMI 25.0-29.9) 07/26/2019   H/o Lyme disease     Sharmon Leyden, PT, MPT 03/18/2021, 11:50 AM  [facility] [address] [place], [place], [address] Phone: [phone]   Fax:  [phone]  Name: KIMARI LIENHARD MRN: [id number] Date of Birth: 12-Nov-1955

## 2021-03-19 ENCOUNTER — Telehealth: Payer: Self-pay | Admitting: *Deleted

## 2021-03-19 NOTE — Telephone Encounter (Signed)
Ortho bundle 30 day call completed. °

## 2021-03-21 ENCOUNTER — Other Ambulatory Visit: Payer: Self-pay

## 2021-03-21 ENCOUNTER — Encounter: Payer: Self-pay | Admitting: Rehabilitative and Restorative Service Providers"

## 2021-03-21 ENCOUNTER — Ambulatory Visit (INDEPENDENT_AMBULATORY_CARE_PROVIDER_SITE_OTHER): Payer: Medicare Other | Admitting: Rehabilitative and Restorative Service Providers"

## 2021-03-21 DIAGNOSIS — R600 Localized edema: Secondary | ICD-10-CM | POA: Diagnosis not present

## 2021-03-21 DIAGNOSIS — M25661 Stiffness of right knee, not elsewhere classified: Secondary | ICD-10-CM | POA: Diagnosis not present

## 2021-03-21 DIAGNOSIS — M6281 Muscle weakness (generalized): Secondary | ICD-10-CM

## 2021-03-21 DIAGNOSIS — R262 Difficulty in walking, not elsewhere classified: Secondary | ICD-10-CM

## 2021-03-21 DIAGNOSIS — M25561 Pain in right knee: Secondary | ICD-10-CM

## 2021-03-21 NOTE — Patient Instructions (Signed)
Continue current HEP 

## 2021-03-21 NOTE — Therapy (Signed)
[facility] [address] [place], [place], [address] Phone: [phone]   Fax:  [phone]  Physical Therapy Treatment  Patient Details  Name: Jodi Mcgrath MRN: [id number] Date of Birth: [birth year] Referring Provider (PT): Gershon Mussel MD   Encounter Date: 03/21/2021   PT End of Session - 03/21/21 1807     Visit Number 5    Number of Visits 24    Date for PT Re-Evaluation 05/31/21    Progress Note Due on Visit 10    PT Start Time 1100    PT Stop Time 1145    PT Time Calculation (min) 45 min    Activity Tolerance Patient tolerated treatment well    Behavior During Therapy [facility] for tasks assessed/performed             Past Medical History:  Diagnosis Date   Anxiety    Depression    Fibromyalgia    H/o Lyme disease    History of kidney stones    Hypertension    Osteoarthritis    Osteoarthritis of right knee 06/27/2020   Osteoporosis    Overweight (BMI 25.0-29.9) 07/26/2019   Preoperative evaluation to rule out surgical contraindication 11/23/2020   Sensorineural hearing loss 10/02/2020   SVT (supraventricular tachycardia) (HCC)     Past Surgical History:  Procedure Laterality Date   ABDOMINAL HYSTERECTOMY     CARDIAC ELECTROPHYSIOLOGY STUDY AND ABLATION     DILATION AND CURETTAGE, DIAGNOSTIC / THERAPEUTIC     KNEE CARTILAGE SURGERY     LITHOTRIPSY     TOTAL KNEE ARTHROPLASTY Right 02/18/2021   Procedure: RIGHT TOTAL KNEE ARTHROPLASTY;  Surgeon: Tarry Kos, MD;  Location: [facility] OR;  Service: Orthopedics;  Laterality: Right;    There were no vitals filed for this visit.   Subjective Assessment - 03/21/21 1114     Subjective Jodi Mcgrath reports better sleep last night.  She reports she is more comfortable on the walker presently which is normal 1 month post-surgery.    Pertinent History Anxiety, OA, fibromyalgia, lyme disease, SVT, HTN, kidney stones, Oseoporosis, depression, hearing loss, knee arthroscopy    Limitations  Standing;Walking;House hold activities    How long can you sit comfortably? Stiffness on standing after about 45 minutes    How long can you stand comfortably? 20 minutes    How long can you walk comfortably? 15 minutes with walker (was 5)    Diagnostic tests X-ray    Patient Stated Goals Walk without device, bend my knee    Currently in Pain? Yes    Pain Score 4     Pain Location Knee    Pain Orientation Right    Pain Descriptors / Indicators Tightness    Pain Type Surgical pain;Chronic pain    Pain Radiating Towards NA    Pain Onset More than a month ago    Pain Frequency Constant    Aggravating Factors  Staying in one position too long or too much WB    Pain Relieving Factors Tramadol before bed and ice, tylenol PRN    Effect of Pain on Daily Activities Uses a walker and limited WB endurance    Multiple Pain Sites No                [facility] PT Assessment - 03/21/21 0001       ROM / Strength   AROM / PROM / Strength AROM      AROM   Overall AROM  Deficits  AROM Assessment Site Knee    Right/Left Knee Right    Right Knee Extension -5    Right Knee Flexion 93                           [facility] Adult PT Treatment/Exercise - 03/21/21 0001       Exercises   Exercises Knee/Hip      Knee/Hip Exercises: Stretches   Other Knee/Hip Stretches Tailgate knee flexion AROM 3 minutes    Other Knee/Hip Stretches Seated knee flexion AAROM (L pushes R into flexion) 10X 10 seconds and supine knee flexion with belt 10X 10 seconds      Knee/Hip Exercises: Aerobic   Recumbent Bike Seat 9 and 10 for 4 minutes each AAROM (back and forth with stretch into flexion and extension emphasis and full range at seat 10)      Knee/Hip Exercises: Machines for Strengthening   Cybex Leg Press B 75# push into full extension and stretch at full flexion for 5 seconds each 15X      Knee/Hip Exercises: Supine   Quad Sets Strengthening;Both;3 sets;10 reps;Limitations    Quad Sets  Limitations 5 seconds (toes back, press knees down and tighten thighs)      Knee/Hip Exercises: Prone   Prone Knee Hang 3 minutes;Weights    Prone Knee Hang Weights (lbs) 1    Prone Knee Hang Limitations Towel roll above knees                    PT Education - 03/21/21 1806     Education Details Reviewed emphasis on edema control and AROM    Person(s) Educated Patient    Methods Explanation;Demonstration;Tactile cues;Verbal cues;Handout    Comprehension Verbal cues required;Returned demonstration;Need further instruction;Tactile cues required;Verbalized understanding              PT Short Term Goals - 03/21/21 1807       PT SHORT TERM GOAL #1   Title Pt will be independent in her intial HEP.    Status Achieved      PT SHORT TERM GOAL #2   Title Pt will improve her 5 time sit to stand to </= 14 seconds with no UE support    Status On-going               PT Long Term Goals - 03/04/21 1623       PT LONG TERM GOAL #1   Title Pt will be independent in her advanced HEP.    Time 12    Period Weeks    Status New    Target Date 05/31/21      PT LONG TERM GOAL #2   Title Pt will be able to amb community distances with no device with pain </= 2/10 in right knee.    Time 12    Period Weeks    Status New    Target Date 05/31/21      PT LONG TERM GOAL #3   Title Pt will improve her right knee extension/ flexion arc to 0-120 degrees.    Time 12    Period Weeks    Status New    Target Date 05/31/21      PT LONG TERM GOAL #4   Title Pt will be able to amb up and down 1 flight of stairs with single hand rail with pain </= 2/10.    Time 12    Period Weeks  Status New    Target Date 05/31/21      PT LONG TERM GOAL #5   Title Pt will improve her FOTO to >/= 66%.    Baseline 49% on 03/04/2021    Time 12    Period Weeks    Status New    Target Date 05/31/21                   Plan - 03/21/21 1807     Clinical Impression Statement Jodi Mcgrath  reports good HEP compliance.  Her AROM continues to make objective progress.  Focus at this point post-TKA remains edema control, AROM and quadriceps strength.  She should be ready for transition to a cane in the next 1-2 weeks as extension AROM and strength progress.    Personal Factors and Comorbidities Comorbidity 3+    Comorbidities anxiety, OA, fibromyalgia, lyme disease, SVT, HTN, kidney stones, Oseoporosis, depression, hearing loss, knee arthroscopy    Examination-Activity Limitations Stairs;Squat;Dressing;Lift;Transfers;Bend;Stand    Examination-Participation Restrictions Community Activity;Other    Stability/Clinical Decision Making Stable/Uncomplicated    Rehab Potential Good    PT Frequency 2x / week    PT Duration 12 weeks    PT Treatment/Interventions ADLs/Self Care Home Management;Electrical Stimulation;Moist Heat;Balance training;Therapeutic exercise;Therapeutic activities;Functional mobility training;Stair training;Gait training;Neuromuscular re-education;Cognitive remediation;Patient/family education;Wheelchair mobility training;Manual techniques;Passive range of motion;Taping;Ultrasound    PT Next Visit Plan Edema control, AROM, quadriceps strength    PT Home Exercise Plan Access Code: [email or web address]  URL: [email or web address]  Date: 03/04/2021  Prepared by: Narda AmberJennifer Martin    Exercises  Supine Heel Slide - 3-4 x daily - 7 x weekly - 3 sets - 10 reps  Seated Heel Slide - 3-4 x daily - 7 x weekly - 3 sets - 10 reps  Supine Active Straight Leg Raise - 3-4 x daily - 7 x weekly - 3 sets - 10 reps  Long Sitting Quad Set with Towel Roll Under Heel - 3-4 x daily - 7 x weekly - 3 sets - 10 reps - 5 seconds hold  Sit to Stand with Counter Support - 3-4 x daily - 7 x weekly - 2 sets - 10 reps  Seated Long Arc Quad - 3-4 x daily - 7 x weekly - 3 sets - 10 reps  Seated Knee Flexion AAROM - 3-4 x daily - 7 x weekly - 10 reps - 10 seconds hold    Consulted and Agree with Plan of Care Patient              Patient will benefit from skilled therapeutic intervention in order to improve the following deficits and impairments:  Pain, Postural dysfunction, Decreased strength, Decreased mobility, Increased edema, Decreased balance, Impaired flexibility, Difficulty walking, Decreased range of motion, Decreased activity tolerance  Visit Diagnosis: Difficulty in walking, not elsewhere classified  Muscle weakness (generalized)  Localized edema  Stiffness of right knee, not elsewhere classified  Acute pain of right knee     Problem List Patient Active Problem List   Diagnosis Date Noted   Status post total right knee replacement 02/18/2021   Preoperative evaluation to rule out surgical contraindication 11/23/2020   Episodic recurrent vertigo 10/04/2020   History of kidney stones 10/02/2020   Osteoporosis 10/02/2020   Sensorineural hearing loss 10/02/2020   Osteoarthritis of right knee 06/27/2020   SVT (supraventricular tachycardia) (HCC) 07/26/2019   Overweight (BMI 25.0-29.9) 07/26/2019   H/o Lyme disease     Cherlyn Cushingob W Avalin Briley PT, MPT 03/21/2021, 6:10 PM  [facility] [address] [place], [place], [address] Phone: [phone]   Fax:  [phone]  Name: Jodi Mcgrath MRN: [id number] Date of Birth: Aug 01, 1955

## 2021-03-26 ENCOUNTER — Ambulatory Visit (INDEPENDENT_AMBULATORY_CARE_PROVIDER_SITE_OTHER): Payer: Medicare Other | Admitting: Rehabilitative and Restorative Service Providers"

## 2021-03-26 ENCOUNTER — Encounter: Payer: Self-pay | Admitting: Rehabilitative and Restorative Service Providers"

## 2021-03-26 ENCOUNTER — Other Ambulatory Visit: Payer: Self-pay

## 2021-03-26 DIAGNOSIS — M25561 Pain in right knee: Secondary | ICD-10-CM

## 2021-03-26 DIAGNOSIS — R600 Localized edema: Secondary | ICD-10-CM

## 2021-03-26 DIAGNOSIS — M25661 Stiffness of right knee, not elsewhere classified: Secondary | ICD-10-CM | POA: Diagnosis not present

## 2021-03-26 DIAGNOSIS — R262 Difficulty in walking, not elsewhere classified: Secondary | ICD-10-CM | POA: Diagnosis not present

## 2021-03-26 DIAGNOSIS — M6281 Muscle weakness (generalized): Secondary | ICD-10-CM | POA: Diagnosis not present

## 2021-03-26 NOTE — Patient Instructions (Signed)
Continue focus on AROM, particularly extension and add seated knee extension stretch during the day as time allows.

## 2021-03-26 NOTE — Therapy (Addendum)
[facility] [address] [place], [place], [address] Phone: [phone]   Fax:  [phone]  Physical Therapy Treatment/Progress Note  Patient Details  Name: Jodi Mcgrath MRN: [id number] Date of Birth: 11/26/1955 Referring Provider (PT): Gershon Mussel MD  Progress Note Reporting Period 03/04/2021 to 03/26/2021  See note below for Objective Data and Assessment of Progress/Goals.     Encounter Date: 03/26/2021   PT End of Session - 03/26/21 1137     Visit Number 6    Number of Visits 24    Date for PT Re-Evaluation 05/31/21    Progress Note Due on Visit 16    PT Start Time 1100    PT Stop Time 1153    PT Time Calculation (min) 53 min    Activity Tolerance Patient tolerated treatment well    Behavior During Therapy [facility] for tasks assessed/performed             Past Medical History:  Diagnosis Date   Anxiety    Depression    Fibromyalgia    H/o Lyme disease    History of kidney stones    Hypertension    Osteoarthritis    Osteoarthritis of right knee 06/27/2020   Osteoporosis    Overweight (BMI 25.0-29.9) 07/26/2019   Preoperative evaluation to rule out surgical contraindication 11/23/2020   Sensorineural hearing loss 10/02/2020   SVT (supraventricular tachycardia) (HCC)     Past Surgical History:  Procedure Laterality Date   ABDOMINAL HYSTERECTOMY     CARDIAC ELECTROPHYSIOLOGY STUDY AND ABLATION     DILATION AND CURETTAGE, DIAGNOSTIC / THERAPEUTIC     KNEE CARTILAGE SURGERY     LITHOTRIPSY     TOTAL KNEE ARTHROPLASTY Right 02/18/2021   Procedure: RIGHT TOTAL KNEE ARTHROPLASTY;  Surgeon: Tarry Kos, MD;  Location: [facility] OR;  Service: Orthopedics;  Laterality: Right;    There were no vitals filed for this visit.   Subjective Assessment - 03/26/21 1103     Subjective Jodi Mcgrath reports good 3X/day HEP compliance.  Tramedol at night (2), tylenol and ice during the day.    Pertinent History Anxiety, OA, fibromyalgia, lyme  disease, SVT, HTN, kidney stones, Oseoporosis, depression, hearing loss, knee arthroscopy    Limitations Standing;Walking;House hold activities    How long can you sit comfortably? Stiffness on standing after about 45 minutes    How long can you stand comfortably? 20 minutes    How long can you walk comfortably? 15 minutes with walker (was 5)    Diagnostic tests X-ray    Patient Stated Goals Walk without device, bend my knee    Currently in Pain? Yes    Pain Score 4     Pain Location Knee    Pain Orientation Right    Pain Descriptors / Indicators Tightness    Pain Type Chronic pain;Surgical pain    Pain Radiating Towards NA    Pain Onset More than a month ago    Pain Frequency Constant    Aggravating Factors  Sleeping is difficult, staying in one position too long or too much WB    Pain Relieving Factors Tramadol before bed, ice and tylenol PRN    Effect of Pain on Daily Activities Uses a walker although she is using it less and less    Multiple Pain Sites No                [facility] PT Assessment - 03/26/21 0001       Observation/Other Assessments  Focus on Therapeutic Outcomes (FOTO)  57 (was 49, Goal 66)      ROM / Strength   AROM / PROM / Strength AROM      AROM   Overall AROM  Deficits    AROM Assessment Site Knee    Right/Left Knee Right    Right Knee Extension -5    Right Knee Flexion 96                           [facility] Adult PT Treatment/Exercise - 03/26/21 0001       Exercises   Exercises Knee/Hip      Knee/Hip Exercises: Stretches   Other Knee/Hip Stretches Tailgate knee flexion AROM 1 minute    Other Knee/Hip Stretches Seated knee flexion AAROM (L pushes R into flexion) 10X 10 seconds and supine knee flexion with belt 10X 10 seconds      Knee/Hip Exercises: Aerobic   Recumbent Bike Seat 9 and 10 for 4 minutes each AAROM (back and forth with stretch into flexion and extension emphasis and full range at seat 10)      Knee/Hip Exercises:  Machines for Strengthening   Cybex Leg Press B 75# push into full extension and stretch at full flexion for 5 seconds each 15X      Knee/Hip Exercises: Seated   Other Seated Knee/Hip Exercises Seated knee extension stretch 1 minute      Knee/Hip Exercises: Supine   Quad Sets Strengthening;Both;3 sets;10 reps;Limitations    Quad Sets Limitations 5 seconds (toes back, press knees down and tighten thighs)      Knee/Hip Exercises: Prone   Prone Knee Hang 3 minutes;Weights    Prone Knee Hang Weights (lbs) 1    Prone Knee Hang Limitations Towel roll above knees                    PT Education - 03/26/21 1134     Education Details Reviewed RA findings with continued emphasis on extension AROM.    Person(s) Educated Patient    Methods Explanation;Demonstration;Tactile cues;Verbal cues    Comprehension Verbalized understanding;Tactile cues required;Need further instruction;Returned demonstration;Verbal cues required              PT Short Term Goals - 03/21/21 1807       PT SHORT TERM GOAL #1   Title Pt will be independent in her intial HEP.    Status Achieved      PT SHORT TERM GOAL #2   Title Pt will improve her 5 time sit to stand to </= 14 seconds with no UE support    Status On-going               PT Long Term Goals - 03/26/21 1135       PT LONG TERM GOAL #1   Title Pt will be independent in her advanced HEP.    Time 12    Period Weeks    Status On-going    Target Date 05/31/21      PT LONG TERM GOAL #2   Title Pt will be able to amb community distances with no device with pain </= 2/10 in right knee.    Baseline Using walker less and less    Time 12    Period Weeks    Status On-going    Target Date 05/31/21      PT LONG TERM GOAL #3   Title Pt will improve her  right knee extension/ flexion arc to 0-120 degrees.    Baseline -5 to 96 degrees on 03/26/2021    Time 12    Period Weeks    Status On-going    Target Date 05/31/21      PT LONG TERM  GOAL #4   Title Pt will be able to amb up and down 1 flight of stairs with single hand rail with pain </= 2/10.    Time 12    Period Weeks    Status On-going    Target Date 05/31/21      PT LONG TERM GOAL #5   Title Pt will improve her FOTO to >/= 66%.    Baseline 57 on 03/26/2021 (was 49 on 03/04/2021)    Time 12    Period Weeks    Status On-going    Target Date 05/31/21                   Plan - 03/26/21 1142     Clinical Impression Statement Jodi Mcgrath is making good early progress towards long-term goals established at evaluation.  AROM is improving and remains the focus with Jodi Mcgrath's early PT (particularly extension).  She is doing a great job with 3X/day HEP compliance and her prognosis to continue to make progress is good with continued supervised PT.    Personal Factors and Comorbidities Comorbidity 3+    Comorbidities anxiety, OA, fibromyalgia, lyme disease, SVT, HTN, kidney stones, Oseoporosis, depression, hearing loss, knee arthroscopy    Examination-Activity Limitations Stairs;Squat;Dressing;Lift;Transfers;Bend;Stand    Examination-Participation Restrictions Community Activity;Other    Stability/Clinical Decision Making Stable/Uncomplicated    Rehab Potential Good    PT Frequency 2x / week    PT Duration 12 weeks    PT Treatment/Interventions ADLs/Self Care Home Management;Electrical Stimulation;Moist Heat;Balance training;Therapeutic exercise;Therapeutic activities;Functional mobility training;Stair training;Gait training;Neuromuscular re-education;Cognitive remediation;Patient/family education;Wheelchair mobility training;Manual techniques;Passive range of motion;Taping;Ultrasound    PT Next Visit Plan Edema control, extension AROM, quadriceps strength    PT Home Exercise Plan Access Code: [email or web address]  URL: [email or web address]  Date: 03/04/2021  Prepared by: Narda Amber    Exercises  Supine Heel Slide - 3-4 x daily - 7 x weekly - 3 sets - 10 reps  Seated  Heel Slide - 3-4 x daily - 7 x weekly - 3 sets - 10 reps  Supine Active Straight Leg Raise - 3-4 x daily - 7 x weekly - 3 sets - 10 reps  Long Sitting Quad Set with Towel Roll Under Heel - 3-4 x daily - 7 x weekly - 3 sets - 10 reps - 5 seconds hold  Sit to Stand with Counter Support - 3-4 x daily - 7 x weekly - 2 sets - 10 reps  Seated Long Arc Quad - 3-4 x daily - 7 x weekly - 3 sets - 10 reps  Seated Knee Flexion AAROM - 3-4 x daily - 7 x weekly - 10 reps - 10 seconds hold    Consulted and Agree with Plan of Care Patient             Patient will benefit from skilled therapeutic intervention in order to improve the following deficits and impairments:  Pain, Postural dysfunction, Decreased strength, Decreased mobility, Increased edema, Decreased balance, Impaired flexibility, Difficulty walking, Decreased range of motion, Decreased activity tolerance  Visit Diagnosis: Difficulty in walking, not elsewhere classified  Muscle weakness (generalized)  Stiffness of right knee, not elsewhere classified  Acute pain of right knee  Localized edema  Problem List Patient Active Problem List   Diagnosis Date Noted   Status post total right knee replacement 02/18/2021   Preoperative evaluation to rule out surgical contraindication 11/23/2020   Episodic recurrent vertigo 10/04/2020   History of kidney stones 10/02/2020   Osteoporosis 10/02/2020   Sensorineural hearing loss 10/02/2020   Osteoarthritis of right knee 06/27/2020   SVT (supraventricular tachycardia) (HCC) 07/26/2019   Overweight (BMI 25.0-29.9) 07/26/2019   H/o Lyme disease     Cherlyn Cushingob W Zerek Litsey PT, MPT 03/26/2021, 1:55 PM  [facility] [address] [place], [place], [address] Phone: [phone]   Fax:  [phone]  Name: Jodi Mcgrath MRN: [id number] Date of Birth: July 19, 1956

## 2021-03-28 ENCOUNTER — Encounter: Payer: Medicare Other | Admitting: Rehabilitative and Restorative Service Providers"

## 2021-03-29 ENCOUNTER — Ambulatory Visit (INDEPENDENT_AMBULATORY_CARE_PROVIDER_SITE_OTHER): Payer: Medicare Other

## 2021-03-29 ENCOUNTER — Other Ambulatory Visit: Payer: Self-pay

## 2021-03-29 ENCOUNTER — Ambulatory Visit (INDEPENDENT_AMBULATORY_CARE_PROVIDER_SITE_OTHER): Payer: Medicare Other | Admitting: Orthopaedic Surgery

## 2021-03-29 ENCOUNTER — Encounter: Payer: Self-pay | Admitting: Orthopaedic Surgery

## 2021-03-29 VITALS — Ht 66.0 in | Wt 175.0 lb

## 2021-03-29 DIAGNOSIS — Z96651 Presence of right artificial knee joint: Secondary | ICD-10-CM

## 2021-03-29 NOTE — Progress Notes (Signed)
Post-Op Visit Note   Patient: Jodi Mcgrath           Date of Birth: 15-Apr-1956           MRN: [id number] Visit Date: 03/29/2021 PCP: Billey Co, MD   Assessment & Plan:  Chief Complaint:  Chief Complaint  Patient presents with   Right Knee - Follow-up    Right total knee arthroplasty 02/18/2021   Visit Diagnoses:  1. Status post total right knee replacement     Plan: Jodi Mcgrath is nearly 6 weeks status post right total knee replacement.  She is overall doing much better and she is progressing in terms of her range of motion and function.  Physical therapy is going well.  No real complaints other than some stiffness and discomfort.  Right knee shows healed surgical scar.  No signs of infection.  Range of motion is 5 to 95degrees without significant pain.  Stable to varus valgus.  X-rays unremarkable.  Wound care instructions reviewed today.  She may continue to increase activity as tolerated.  She may drive.  Dental prophylaxis reinforced.  Discontinue TED hose.  Continue physical therapy.  Follow-up in 6 weeks for 22-month recheck.  Follow-Up Instructions: Return in about 6 weeks (around 05/10/2021).   Orders:  Orders Placed This Encounter  Procedures   XR Knee 1-2 Views Right   No orders of the defined types were placed in this encounter.   Imaging: XR Knee 1-2 Views Right  Result Date: 03/29/2021 Stable total knee replacement in good alignment    PMFS History: Patient Active Problem List   Diagnosis Date Noted   Status post total right knee replacement 02/18/2021   Preoperative evaluation to rule out surgical contraindication 11/23/2020   Episodic recurrent vertigo 10/04/2020   History of kidney stones 10/02/2020   Osteoporosis 10/02/2020   Sensorineural hearing loss 10/02/2020   Osteoarthritis of right knee 06/27/2020   SVT (supraventricular tachycardia) (HCC) 07/26/2019   Overweight (BMI 25.0-29.9) 07/26/2019   H/o Lyme disease    Past Medical History:   Diagnosis Date   Anxiety    Depression    Fibromyalgia    H/o Lyme disease    History of kidney stones    Hypertension    Osteoarthritis    Osteoarthritis of right knee 06/27/2020   Osteoporosis    Overweight (BMI 25.0-29.9) 07/26/2019   Preoperative evaluation to rule out surgical contraindication 11/23/2020   Sensorineural hearing loss 10/02/2020   SVT (supraventricular tachycardia) (HCC)     Family History  Problem Relation Age of Onset   CAD Father        CABG in 70's   Non-Hodgkin's lymphoma Father    CAD Brother        CABG at 34 and SCD at 70   Diabetes Brother    Diabetes Brother     Past Surgical History:  Procedure Laterality Date   ABDOMINAL HYSTERECTOMY     CARDIAC ELECTROPHYSIOLOGY STUDY AND ABLATION     DILATION AND CURETTAGE, DIAGNOSTIC / THERAPEUTIC     KNEE CARTILAGE SURGERY     LITHOTRIPSY     TOTAL KNEE ARTHROPLASTY Right 02/18/2021   Procedure: RIGHT TOTAL KNEE ARTHROPLASTY;  Surgeon: Tarry Kos, MD;  Location: [facility];  Service: Orthopedics;  Laterality: Right;   Social History   Occupational History   Not on file  Tobacco Use   Smoking status: Never   Smokeless tobacco: Never  Vaping Use   Vaping Use: Never  used  Substance and Sexual Activity   Alcohol use: Never   Drug use: Never   Sexual activity: Not on file

## 2021-04-02 ENCOUNTER — Other Ambulatory Visit: Payer: Self-pay

## 2021-04-02 ENCOUNTER — Encounter: Payer: Self-pay | Admitting: Physical Therapy

## 2021-04-02 ENCOUNTER — Ambulatory Visit (INDEPENDENT_AMBULATORY_CARE_PROVIDER_SITE_OTHER): Payer: Medicare Other | Admitting: Physical Therapy

## 2021-04-02 DIAGNOSIS — M25661 Stiffness of right knee, not elsewhere classified: Secondary | ICD-10-CM | POA: Diagnosis not present

## 2021-04-02 DIAGNOSIS — R262 Difficulty in walking, not elsewhere classified: Secondary | ICD-10-CM

## 2021-04-02 DIAGNOSIS — M6281 Muscle weakness (generalized): Secondary | ICD-10-CM | POA: Diagnosis not present

## 2021-04-02 DIAGNOSIS — M25561 Pain in right knee: Secondary | ICD-10-CM

## 2021-04-02 DIAGNOSIS — R600 Localized edema: Secondary | ICD-10-CM

## 2021-04-02 NOTE — Therapy (Addendum)
[facility] [address] [place], [place], [address] Phone: [phone]   Fax:  [phone]  Physical Therapy Treatment/Discharge  Patient Details  Name: Jodi Mcgrath MRN: [id number] Date of Birth: Dec 08, 1955 Referring Provider (PT): Frankey Shown MD  PHYSICAL THERAPY DISCHARGE SUMMARY  Visits from Start of Care: 7  Current functional level related to goals / functional outcomes: See note   Remaining deficits: See note   Education / Equipment: HEP   Patient agrees to discharge. Patient goals were partially met. Patient is being discharged due to  family emergency that requires her to be in [place] for several weeks.   Encounter Date: 04/02/2021   PT End of Session - 04/02/21 1054     Visit Number 7    Number of Visits 24    Date for PT Re-Evaluation 05/31/21    Progress Note Due on Visit 16    PT Start Time 1015    PT Stop Time 1055    PT Time Calculation (min) 40 min    Activity Tolerance Patient tolerated treatment well    Behavior During Therapy [facility] for tasks assessed/performed             Past Medical History:  Diagnosis Date   Anxiety    Depression    Fibromyalgia    H/o Lyme disease    History of kidney stones    Hypertension    Osteoarthritis    Osteoarthritis of right knee 06/27/2020   Osteoporosis    Overweight (BMI 25.0-29.9) 07/26/2019   Preoperative evaluation to rule out surgical contraindication 11/23/2020   Sensorineural hearing loss 10/02/2020   SVT (supraventricular tachycardia) (HCC)     Past Surgical History:  Procedure Laterality Date   ABDOMINAL HYSTERECTOMY     CARDIAC ELECTROPHYSIOLOGY STUDY AND ABLATION     DILATION AND CURETTAGE, DIAGNOSTIC / THERAPEUTIC     KNEE CARTILAGE SURGERY     LITHOTRIPSY     TOTAL KNEE ARTHROPLASTY Right 02/18/2021   Procedure: RIGHT TOTAL KNEE ARTHROPLASTY;  Surgeon: Leandrew Koyanagi, MD;  Location: [place];  Service: Orthopedics;  Laterality: Right;     There were no vitals filed for this visit.   Subjective Assessment - 04/02/21 1042     Subjective Pt arriving reporting compliance with HEP. Pt reporting 3/10 pain in right knee but more stiffness. Pt feels since transitioning to the st cane she has noticed more swelling.    Pertinent History Anxiety, OA, fibromyalgia, lyme disease, SVT, HTN, kidney stones, Oseoporosis, depression, hearing loss, knee arthroscopy    Limitations Standing;Walking;House hold activities    How long can you sit comfortably? Stiffness on standing after about 45 minutes    How long can you stand comfortably? 20 minutes    How long can you walk comfortably? 15 minutes with walker (was 5)    Patient Stated Goals Walk without device, bend my knee    Currently in Pain? Yes    Pain Score 3     Pain Location Knee    Pain Orientation Right    Pain Descriptors / Indicators Aching;Tightness    Pain Type Surgical pain    Pain Onset More than a month ago                [facility] PT Assessment - 04/02/21 0001       AROM   AROM Assessment Site Knee    Right/Left Knee Right    Right Knee Extension -5    Right Knee  Flexion 100      PROM   PROM Assessment Site Knee    Right/Left Knee Right    Right Knee Extension -4    Right Knee Flexion 102                           [facility] Adult PT Treatment/Exercise - 04/02/21 0001       Exercises   Exercises Knee/Hip      Knee/Hip Exercises: Aerobic   Recumbent Bike not available    Nustep L5 x 8 minutes      Knee/Hip Exercises: Machines for Strengthening   Cybex Leg Press B 75# 3x10, R LE only: 37# 3x10      Knee/Hip Exercises: Supine   Quad Sets Strengthening;Both;3 sets;10 reps;Limitations    Quad Sets Limitations ankle on ball, holding 5 seconds      Manual Therapy   Manual therapy comments extension with overpressure, PROM knee flexion                       PT Short Term Goals - 03/21/21 1807       PT SHORT TERM GOAL  #1   Title Pt will be independent in her intial HEP.    Status Achieved      PT SHORT TERM GOAL #2   Title Pt will improve her 5 time sit to stand to </= 14 seconds with no UE support    Status On-going               PT Long Term Goals - 04/02/21 1227       PT LONG TERM GOAL #1   Title Pt will be independent in her advanced HEP.    Status On-going      PT LONG TERM GOAL #2   Title Pt will be able to amb community distances with no device with pain </= 2/10 in right knee.    Status On-going      PT LONG TERM GOAL #3   Title Pt will improve her right knee extension/ flexion arc to 0-120 degrees.    Baseline 5-100 degree arc on 04/02/2021    Status On-going      PT LONG TERM GOAL #4   Title Pt will be able to amb up and down 1 flight of stairs with single hand rail with pain </= 2/10.    Status On-going      PT LONG TERM GOAL #5   Title Pt will improve her FOTO to >/= 66%.    Status On-going                   Plan - 04/02/21 1218     Clinical Impression Statement Pt is making progress with ROM and strengthening. Pt's AROM arc today was 5-100 degrees. Gait training performed using straight cane. Pt amb 400 feet on level surfaces with step through gait pattern using st cane. Pt was able to navigate 1 flight of stairs with step to gait pattern using st cane and single hand rail. Continue skilled PT to maximize function.    Personal Factors and Comorbidities Comorbidity 3+    Comorbidities anxiety, OA, fibromyalgia, lyme disease, SVT, HTN, kidney stones, Oseoporosis, depression, hearing loss, knee arthroscopy    Examination-Activity Limitations Stairs;Squat;Dressing;Lift;Transfers;Bend;Stand    Examination-Participation Restrictions Community Activity;Other    Stability/Clinical Decision Making Stable/Uncomplicated    Rehab Potential Good    PT Frequency 2x /  week    PT Duration 12 weeks    PT Treatment/Interventions ADLs/Self Care Home Management;Electrical  Stimulation;Moist Heat;Balance training;Therapeutic exercise;Therapeutic activities;Functional mobility training;Stair training;Gait training;Neuromuscular re-education;Cognitive remediation;Patient/family education;Wheelchair mobility training;Manual techniques;Passive range of motion;Taping;Ultrasound    PT Next Visit Plan extension and flexion AROM, quadriceps strength, stair training,    PT Home Exercise Plan Access Code: [email or web address]  URL: [email or web address]  Date: 03/04/2021  Prepared by: Kearney Hard    Exercises  Supine Heel Slide - 3-4 x daily - 7 x weekly - 3 sets - 10 reps  Seated Heel Slide - 3-4 x daily - 7 x weekly - 3 sets - 10 reps  Supine Active Straight Leg Raise - 3-4 x daily - 7 x weekly - 3 sets - 10 reps  Long Sitting Quad Set with Towel Roll Under Heel - 3-4 x daily - 7 x weekly - 3 sets - 10 reps - 5 seconds hold  Sit to Stand with Counter Support - 3-4 x daily - 7 x weekly - 2 sets - 10 reps  Seated Long Arc Quad - 3-4 x daily - 7 x weekly - 3 sets - 10 reps  Seated Knee Flexion AAROM - 3-4 x daily - 7 x weekly - 10 reps - 10 seconds hold    Consulted and Agree with Plan of Care Patient             Patient will benefit from skilled therapeutic intervention in order to improve the following deficits and impairments:  Pain, Postural dysfunction, Decreased strength, Decreased mobility, Increased edema, Decreased balance, Impaired flexibility, Difficulty walking, Decreased range of motion, Decreased activity tolerance  Visit Diagnosis: Difficulty in walking, not elsewhere classified  Muscle weakness (generalized)  Stiffness of right knee, not elsewhere classified  Acute pain of right knee  Localized edema     Problem List Patient Active Problem List   Diagnosis Date Noted   Status post total right knee replacement 02/18/2021   Preoperative evaluation to rule out surgical contraindication 11/23/2020   Episodic recurrent vertigo 10/04/2020   History  of kidney stones 10/02/2020   Osteoporosis 10/02/2020   Sensorineural hearing loss 10/02/2020   Osteoarthritis of right knee 06/27/2020   SVT (supraventricular tachycardia) (HCC) 07/26/2019   Overweight (BMI 25.0-29.9) 07/26/2019   H/o Lyme disease     Oretha Caprice, PT, MPT 04/02/2021, 12:31 PM  Farley Ly PT, MPT  [facility] Therapy [address] [place], [place], [address] Phone: [phone]   Fax:  [phone]  Name: ERNIE SAGRERO MRN: [id number] Date of Birth: [birth year]

## 2021-04-09 ENCOUNTER — Encounter: Payer: Medicare Other | Admitting: Physical Therapy

## 2021-04-15 ENCOUNTER — Encounter: Payer: Medicare Other | Admitting: Physical Therapy

## 2021-04-17 ENCOUNTER — Encounter: Payer: Self-pay | Admitting: Physical Therapy

## 2021-04-18 ENCOUNTER — Encounter: Payer: Medicare Other | Admitting: Rehabilitative and Restorative Service Providers"

## 2021-04-23 ENCOUNTER — Encounter: Payer: Medicare Other | Admitting: Physical Therapy

## 2021-04-25 ENCOUNTER — Encounter: Payer: Medicare Other | Admitting: Rehabilitative and Restorative Service Providers"

## 2021-04-30 ENCOUNTER — Encounter: Payer: Medicare Other | Admitting: Physical Therapy

## 2021-05-02 ENCOUNTER — Encounter: Payer: Medicare Other | Admitting: Rehabilitative and Restorative Service Providers"

## 2021-05-06 ENCOUNTER — Encounter: Payer: Self-pay | Admitting: Orthopaedic Surgery

## 2021-05-07 ENCOUNTER — Encounter: Payer: Medicare Other | Admitting: Physical Therapy

## 2021-05-09 ENCOUNTER — Encounter: Payer: Medicare Other | Admitting: Rehabilitative and Restorative Service Providers"

## 2021-05-10 ENCOUNTER — Encounter: Payer: Medicare Other | Admitting: Orthopaedic Surgery

## 2021-05-14 ENCOUNTER — Encounter: Payer: Medicare Other | Admitting: Physical Therapy

## 2021-05-16 ENCOUNTER — Encounter: Payer: Medicare Other | Admitting: Rehabilitative and Restorative Service Providers"

## 2021-05-28 ENCOUNTER — Telehealth: Payer: Self-pay | Admitting: *Deleted

## 2021-05-28 NOTE — Telephone Encounter (Signed)
Attempted 90 day call to patient; left VM requesting call back to check status and see if she has any current needs.

## 2021-06-27 ENCOUNTER — Encounter: Payer: Self-pay | Admitting: Orthopaedic Surgery

## 2021-06-27 ENCOUNTER — Other Ambulatory Visit: Payer: Self-pay

## 2021-06-27 ENCOUNTER — Ambulatory Visit (INDEPENDENT_AMBULATORY_CARE_PROVIDER_SITE_OTHER): Payer: Medicare Other | Admitting: Orthopaedic Surgery

## 2021-06-27 DIAGNOSIS — Z96651 Presence of right artificial knee joint: Secondary | ICD-10-CM | POA: Diagnosis not present

## 2021-06-27 NOTE — Progress Notes (Signed)
Office Visit Note   Patient: Jodi Mcgrath           Date of Birth: 05-24-1956           MRN: [id number] Visit Date: 06/27/2021              Requested by: Billey Co, MD [address] [place],  [place] [address] PCP: Billey Co, MD   Assessment & Plan: Visit Diagnoses:  1. Status post total right knee replacement     Plan: Jodi Mcgrath is 4 months status post right total knee replacement.  She recently returned back from  where she was taking care of her son.  Unfortunately due to family issues she had to pause physical therapy she has been able to do home exercises and a stationary bike.  She does not report any functional limitations with the knee other than some increased swelling with activity.  Right knee shows a fully healed surgical scar.  Stable to varus valgus.  Range of motion is roughly 0 to 110 degrees.  No joint effusion.  No signs of infection.  From my standpoint Jodi Mcgrath is recovering appropriately.  I do feel that she would benefit from restarting outpatient PT to really focus on strengthening.  Dental prophylaxis reinforced.  She is functionally limited by her range of motion.  We will see her back in 2 months with two-view x-rays of the right knee.  Follow-Up Instructions: Return in about 2 months (around 08/28/2021).   Orders:  Orders Placed This Encounter  Procedures   Ambulatory referral to Physical Therapy   No orders of the defined types were placed in this encounter.     Procedures: No procedures performed   Clinical Data: No additional findings.   Subjective: Chief Complaint  Patient presents with   Right Knee - Follow-up    Right TKA 02/18/2021    HPI  Review of Systems   Objective: Vital Signs: There were no vitals taken for this visit.  Physical Exam  Ortho Exam  Specialty Comments:  No specialty comments available.  Imaging: No results found.   PMFS History: Patient Active Problem List   Diagnosis Date  Noted   Status post total right knee replacement 02/18/2021   Preoperative evaluation to rule out surgical contraindication 11/23/2020   Episodic recurrent vertigo 10/04/2020   History of kidney stones 10/02/2020   Osteoporosis 10/02/2020   Sensorineural hearing loss 10/02/2020   Osteoarthritis of right knee 06/27/2020   SVT (supraventricular tachycardia) (HCC) 07/26/2019   Overweight (BMI 25.0-29.9) 07/26/2019   H/o Lyme disease    Past Medical History:  Diagnosis Date   Anxiety    Depression    Fibromyalgia    H/o Lyme disease    History of kidney stones    Hypertension    Osteoarthritis    Osteoarthritis of right knee 06/27/2020   Osteoporosis    Overweight (BMI 25.0-29.9) 07/26/2019   Preoperative evaluation to rule out surgical contraindication 11/23/2020   Sensorineural hearing loss 10/02/2020   SVT (supraventricular tachycardia) (HCC)     Family History  Problem Relation Age of Onset   CAD Father        CABG in 57's   Non-Hodgkin's lymphoma Father    CAD Brother        CABG at 61 and SCD at 34   Diabetes Brother    Diabetes Brother     Past Surgical History:  Procedure Laterality Date   ABDOMINAL HYSTERECTOMY  CARDIAC ELECTROPHYSIOLOGY STUDY AND ABLATION     DILATION AND CURETTAGE, DIAGNOSTIC / THERAPEUTIC     KNEE CARTILAGE SURGERY     LITHOTRIPSY     TOTAL KNEE ARTHROPLASTY Right 02/18/2021   Procedure: RIGHT TOTAL KNEE ARTHROPLASTY;  Surgeon: Tarry Kos, MD;  Location: [facility];  Service: Orthopedics;  Laterality: Right;   Social History   Occupational History   Not on file  Tobacco Use   Smoking status: Never   Smokeless tobacco: Never  Vaping Use   Vaping Use: Never used  Substance and Sexual Activity   Alcohol use: Never   Drug use: Never   Sexual activity: Not on file

## 2021-06-29 ENCOUNTER — Encounter: Payer: Self-pay | Admitting: Family Medicine

## 2021-07-10 ENCOUNTER — Ambulatory Visit (INDEPENDENT_AMBULATORY_CARE_PROVIDER_SITE_OTHER): Payer: Medicare Other | Admitting: Physical Therapy

## 2021-07-10 ENCOUNTER — Other Ambulatory Visit: Payer: Self-pay

## 2021-07-10 ENCOUNTER — Encounter: Payer: Self-pay | Admitting: Physical Therapy

## 2021-07-10 ENCOUNTER — Telehealth: Payer: Self-pay | Admitting: *Deleted

## 2021-07-10 DIAGNOSIS — R262 Difficulty in walking, not elsewhere classified: Secondary | ICD-10-CM

## 2021-07-10 DIAGNOSIS — R600 Localized edema: Secondary | ICD-10-CM

## 2021-07-10 DIAGNOSIS — M6281 Muscle weakness (generalized): Secondary | ICD-10-CM | POA: Diagnosis not present

## 2021-07-10 DIAGNOSIS — M25561 Pain in right knee: Secondary | ICD-10-CM

## 2021-07-10 DIAGNOSIS — M25661 Stiffness of right knee, not elsewhere classified: Secondary | ICD-10-CM

## 2021-07-10 NOTE — Telephone Encounter (Signed)
Attempted 2nd time to reach patient after her return from staying out of state. She was seen by MD recently and decided to restart her OPPT. Attempted 90 day call to patient. Left VM requesting call back.

## 2021-07-10 NOTE — Patient Instructions (Signed)
Access Code: [email or web address] URL: [email or web address] Date: 07/10/2021 Prepared by: Narda Amber  Exercises Supine Active Straight Leg Raise - 2-3 x daily - 7 x weekly - 2 sets - 10 reps Long Sitting Quad Set with Towel Roll Under Heel - 2-3 x daily - 7 x weekly - 2 sets - 10 reps - 5 seconds hold Sit to Stand with Counter Support - 2-3 x daily - 7 x weekly - 2 sets - 10 reps Seated Long Arc Quad - 2-3 x daily - 7 x weekly - 2 sets - 10 reps - 5 seconds hold Supine Heel Slide with Strap - 2 x daily - 7 x weekly - 2 sets - 10 reps - seconds hold Standing Hip Abduction with Counter Support - 2 x daily - 7 x weekly - 2 sets - 10 reps

## 2021-07-10 NOTE — Therapy (Signed)
[facility] [address] [place], [place], [address] Phone: [phone]   Fax:  [phone]  Physical Therapy Evaluation  Patient Details  Name: Jodi Mcgrath MRN: [id number] Date of Birth: 08/25/1955 Referring Provider (PT): Jodi Mussel MD   Encounter Date: 07/10/2021   PT End of Session - 07/10/21 1053     Visit Number 1    Number of Visits 13    Date for PT Re-Evaluation 08/23/21    Progress Note Due on Visit 10    PT Start Time 1015    PT Stop Time 1058    PT Time Calculation (min) 43 min    Activity Tolerance Patient tolerated treatment well    Behavior During Therapy [facility] for tasks assessed/performed             Past Medical History:  Diagnosis Date   Anxiety    Depression    Fibromyalgia    H/o Lyme disease    History of kidney stones    Hypertension    Osteoarthritis    Osteoarthritis of right knee 06/27/2020   Osteoporosis    Overweight (BMI 25.0-29.9) 07/26/2019   Preoperative evaluation to rule out surgical contraindication 11/23/2020   Sensorineural hearing loss 10/02/2020   SVT (supraventricular tachycardia) (HCC)     Past Surgical History:  Procedure Laterality Date   ABDOMINAL HYSTERECTOMY     CARDIAC ELECTROPHYSIOLOGY STUDY AND ABLATION     DILATION AND CURETTAGE, DIAGNOSTIC / THERAPEUTIC     KNEE CARTILAGE SURGERY     LITHOTRIPSY     TOTAL KNEE ARTHROPLASTY Right 02/18/2021   Procedure: RIGHT TOTAL KNEE ARTHROPLASTY;  Surgeon: Tarry Kos, MD;  Location: [facility];  Service: Orthopedics;  Laterality: Right;    There were no vitals filed for this visit.    Subjective Assessment - 07/10/21 1050     Subjective Pt s/p right TKA on 02/18/2021. Pt was able to attend therapy for a few weeks but had to leave unexpectedly due to family emergency out of town. Now pt has returned and wishing to continue her TKA rehab to progress toward maximum funciton. Pt stating she has been doing some of her HEP and riding a  bike at home.    Pertinent History anxiety, OA, fibromyalgia, lyme disease, SVT, HTN, kidney stones, Oseoporosis, depression, hearing loss, knee arthroscopy    Limitations Standing;Walking;House hold activities    Diagnostic tests X-ray    Patient Stated Goals Walk wituout pain    Currently in Pain? No/denies   pt stating pain is usually a 2-3/10 depending on activities               [facility] PT Assessment - 07/10/21 0001       Assessment   Medical Diagnosis Z96.651 s/p right knee arthroplasty    Referring Provider (PT) Jodi Mussel MD    Onset Date/Surgical Date 02/18/21    Hand Dominance Left    Prior Therapy yes      Precautions   Precautions None      Restrictions   Weight Bearing Restrictions No      Balance Screen   Has the patient fallen in the past 6 months No    Is the patient reluctant to leave their home because of a fear of falling?  No      Home Environment   Living Environment Private residence    Living Arrangements Spouse/significant other    Available Help at Discharge Family    Type of  Home House    Home Access Stairs to enter    Entrance Stairs-Number of Steps 2    Home Layout One level      Prior Function   Level of Independence Independent    Vocation Retired    Leisure be with family, walking      Cognition   Overall Cognitive Status Within Functional Limits for tasks assessed      Observation/Other Assessments   Focus on Therapeutic Outcomes (FOTO)  69% (predicted 72%)      Posture/Postural Control   Posture/Postural Control Postural limitations    Postural Limitations Rounded Shoulders;Forward head      ROM / Strength   AROM / PROM / Strength AROM;Strength;PROM      AROM   AROM Assessment Site Knee    Right/Left Knee Right;Left    Right Knee Extension -2    Right Knee Flexion 112    Left Knee Extension 0    Left Knee Flexion 130      PROM   PROM Assessment Site Knee    Right/Left Knee Right    Right Knee Extension 0    Right  Knee Flexion 114      Strength   Strength Assessment Site Knee    Right/Left Knee Right;Left    Right Knee Flexion 4/5    Right Knee Extension 4/5    Left Knee Flexion 5/5    Left Knee Extension 5/5      Palpation   Palpation comment TTP along patella insertion and medial joint line      Transfers   Five time sit to stand comments  20 seconds NO UE support      Ambulation/Gait   Gait Pattern Antalgic   mild   Gait Comments amb with no device, step through gait pattern.                        Objective measurements completed on examination: See above findings.       [facility] Adult PT Treatment/Exercise - 07/10/21 0001       Exercises   Exercises Knee/Hip      Knee/Hip Exercises: Aerobic   Recumbent Bike L2  7 minutes      Knee/Hip Exercises: Standing   Other Standing Knee Exercises hip abduciton at counter x 10      Knee/Hip Exercises: Seated   Long Arc Quad Strengthening;Right;10 reps    Heel Slides AAROM;Right;5 reps    Sit to Sand 5 reps;without UE support      Knee/Hip Exercises: Supine   Quad Sets Strengthening;Right;5 reps    Quad Sets Limitations with overpressure                     PT Education - 07/10/21 1052     Education Details PT POC, HEP    Person(s) Educated Patient    Methods Explanation;Demonstration;Tactile cues;Verbal cues;Handout    Comprehension Returned demonstration;Verbalized understanding              PT Short Term Goals - 07/10/21 1017       PT SHORT TERM GOAL #1   Title Pt will be independent in her intial HEP.    Time 3    Period Weeks    Status New    Target Date 08/09/21      PT SHORT TERM GOAL #2   Title Pt will improve her 5 time sit to stand to </= 14 seconds  with no UE support    Time 3    Period Weeks    Status New    Target Date 08/09/21               PT Long Term Goals - 07/10/21 1017       PT LONG TERM GOAL #1   Title Pt will be independent in her advanced HEP.     Time 6    Period Weeks    Status New    Target Date 08/23/21      PT LONG TERM GOAL #2   Title Pt will be able to amb community distances > 1000 feet with no device with pain </= 2/10 in right knee.    Baseline pt amb with mild antalgic gait    Time 6    Period Weeks    Status New    Target Date 08/23/21      PT LONG TERM GOAL #3   Title Pt will improve her right knee extension/ flexion arc to 0-125 degrees.    Baseline 2-112    Time 6    Period Weeks    Status New    Target Date 08/23/21      PT LONG TERM GOAL #4   Title Pt will be able to amb up and down 1 flight of stairs with no hand rail with pain </= 2/10.    Time 6    Period Weeks    Status New    Target Date 08/23/21      PT LONG TERM GOAL #5   Title Pt will improve her FOTO to >/= 72%    Baseline 69% on 07/10/2021    Time 6    Period Weeks    Status New    Target Date 08/23/21                    Plan - 07/10/21 1056     Clinical Impression Statement Pt arriving to therapy follow right TKA on 02/18/2021 after taking a 3 month break from therapy due to family emergency. Pt reporting no pain at presnet but bouts of intermittent pain when walking or fist standing after prolonged sitting. Pt also stating she experiences episodes of "limping" and feeling like her knee is going to "buckle:Marland Kitchen Pt presenting with mild weakness in her right quads and hamstrings when compared to the left LE. Pt's ROM arc is 2-112 degrees. Pt has a bike she has been working on at home. I updated pt's HEP for pt to continue.    Personal Factors and Comorbidities Comorbidity 3+    Comorbidities MH: anxiety, OA, fibromyalgia, lyme disease, SVT, HTN, kidney stones, Oseoporosis, depression, hearing loss, knee arthroscopy    Examination-Participation Restrictions Community Activity;Other;Shop    Stability/Clinical Decision Making Stable/Uncomplicated    Clinical Decision Making Low    Rehab Potential Good    PT Frequency 2x / week    PT  Duration 6 weeks    PT Treatment/Interventions ADLs/Self Care Home Management;Cryotherapy;Electrical Stimulation;Gait training;Stair training;Functional mobility training;Therapeutic exercise;Balance training;Therapeutic activities;Patient/family education;Passive range of motion;Manual techniques;Dry needling;Taping;Neuromuscular re-education;Joint Manipulations;Vasopneumatic Device;Moist Heat    PT Next Visit Plan Bike, leg press, knee extension, squats, dynamic balance, LAQ with weights, SLS activities    PT Home Exercise Plan Access Code: [email or web address]  URL: [email or web address]  Date: 07/10/2021  Prepared by: Narda Amber    Exercises  Supine Active Straight Leg Raise - 2-3 x daily - 7 x weekly - 2 sets -  10 reps  Long Sitting Quad Set with Towel Roll Under Heel - 2-3 x daily - 7 x weekly - 2 sets - 10 reps - 5 seconds hold  Sit to Stand with Counter Support - 2-3 x daily - 7 x weekly - 2 sets - 10 reps  Seated Long Arc Quad - 2-3 x daily - 7 x weekly - 2 sets - 10 reps - 5 seconds hold  Supine Heel Slide with Strap - 2 x daily - 7 x weekly - 2 sets - 10 reps - seconds hold  Standing Hip Abduction with Counter Support - 2 x daily - 7 x weekly - 2 sets - 10 reps    Consulted and Agree with Plan of Care Patient             Patient will benefit from skilled therapeutic intervention in order to improve the following deficits and impairments:  Pain, Difficulty walking, Decreased range of motion, Decreased strength, Increased edema, Decreased balance, Impaired flexibility  Visit Diagnosis: Difficulty in walking, not elsewhere classified  Muscle weakness (generalized)  Stiffness of right knee, not elsewhere classified  Acute pain of right knee  Localized edema     Problem List Patient Active Problem List   Diagnosis Date Noted   Status post total right knee replacement 02/18/2021   Preoperative evaluation to rule out surgical contraindication 11/23/2020   Episodic  recurrent vertigo 10/04/2020   History of kidney stones 10/02/2020   Osteoporosis 10/02/2020   Sensorineural hearing loss 10/02/2020   Osteoarthritis of right knee 06/27/2020   SVT (supraventricular tachycardia) (HCC) 07/26/2019   Overweight (BMI 25.0-29.9) 07/26/2019   H/o Lyme disease     Sharmon Leyden, PT, MPT 07/10/2021, 12:09 PM  [facility] [address] [place], [place], [address] Phone: [phone]   Fax:  [phone]  Name: Jodi Mcgrath MRN: [id number] Date of Birth: Apr 11, 1956

## 2021-07-29 ENCOUNTER — Other Ambulatory Visit: Payer: Self-pay | Admitting: Family Medicine

## 2021-07-29 DIAGNOSIS — I471 Supraventricular tachycardia: Secondary | ICD-10-CM

## 2021-07-30 ENCOUNTER — Encounter: Payer: Medicare Other | Admitting: Physical Therapy

## 2021-08-01 ENCOUNTER — Encounter: Payer: Medicare Other | Admitting: Rehabilitative and Restorative Service Providers"

## 2021-08-05 ENCOUNTER — Other Ambulatory Visit: Payer: Self-pay

## 2021-08-05 ENCOUNTER — Encounter: Payer: Self-pay | Admitting: Physical Therapy

## 2021-08-05 ENCOUNTER — Ambulatory Visit (INDEPENDENT_AMBULATORY_CARE_PROVIDER_SITE_OTHER): Payer: Medicare Other | Admitting: Physical Therapy

## 2021-08-05 DIAGNOSIS — M25661 Stiffness of right knee, not elsewhere classified: Secondary | ICD-10-CM

## 2021-08-05 DIAGNOSIS — R600 Localized edema: Secondary | ICD-10-CM

## 2021-08-05 DIAGNOSIS — M6281 Muscle weakness (generalized): Secondary | ICD-10-CM

## 2021-08-05 DIAGNOSIS — M25561 Pain in right knee: Secondary | ICD-10-CM | POA: Diagnosis not present

## 2021-08-05 DIAGNOSIS — R262 Difficulty in walking, not elsewhere classified: Secondary | ICD-10-CM

## 2021-08-05 NOTE — Therapy (Addendum)
[place], [place], [address] Phone: [phone]   Fax:  [phone]  Physical Therapy Treatment Discharge Summary  Patient Details  Name: Jodi Mcgrath MRN: [id number] Date of Birth: Aug 11, 1955 Referring Provider (PT): Frankey Shown MD   Encounter Date: 08/05/2021   PT End of Session - 08/05/21 1158     Visit Number 2    Number of Visits 13    Date for PT Re-Evaluation 08/23/21    Progress Note Due on Visit 10    PT Start Time 1147    PT Stop Time 1228    PT Time Calculation (min) 41 min    Activity Tolerance Patient tolerated treatment well             Past Medical History:  Diagnosis Date   Anxiety    Depression    Fibromyalgia    H/o Lyme disease    History of kidney stones    Hypertension    Osteoarthritis    Osteoarthritis of right knee 06/27/2020   Osteoporosis    Overweight (BMI 25.0-29.9) 07/26/2019   Preoperative evaluation to rule out surgical contraindication 11/23/2020   Sensorineural hearing loss 10/02/2020   SVT (supraventricular tachycardia) (HCC)     Past Surgical History:  Procedure Laterality Date   ABDOMINAL HYSTERECTOMY     CARDIAC ELECTROPHYSIOLOGY STUDY AND ABLATION     DILATION AND CURETTAGE, DIAGNOSTIC / THERAPEUTIC     KNEE CARTILAGE SURGERY     LITHOTRIPSY     TOTAL KNEE ARTHROPLASTY Right 02/18/2021   Procedure: RIGHT TOTAL KNEE ARTHROPLASTY;  Surgeon: Leandrew Koyanagi, MD;  Location: [facility];  Service: Orthopedics;  Laterality: Right;    There were no vitals filed for this visit.   Subjective Assessment - 08/05/21 1157     Subjective Pt arriving today reporting no pain at rest and with ADL's.    Pertinent History anxiety, OA, fibromyalgia, lyme disease, SVT, HTN, kidney stones, Oseoporosis, depression, hearing loss, knee arthroscopy    Limitations Standing;Walking;House hold activities    Diagnostic tests X-ray    Patient Stated Goals Walk wituout pain    Currently  in Pain? No/denies                [facility] PT Assessment - 08/05/21 0001       Assessment   Medical Diagnosis Z96.651 s/p right knee arthroplasty    Referring Provider (PT) Frankey Shown MD    Onset Date/Surgical Date 02/18/21      AROM   Right/Left Knee Right    Right Knee Extension -2    Right Knee Flexion 114                           [facility] Adult PT Treatment/Exercise - 08/05/21 0001       Exercises   Exercises Knee/Hip      Knee/Hip Exercises: Aerobic   Recumbent Bike L2 x 3 minutes with increased pain in right knee, the exercise was stopped      Knee/Hip Exercises: Standing   Heel Raises Both;15 reps    Lateral Step Up 15 reps;Step Height: 6";Hand Hold: 2    Forward Step Up 15 reps;Step Height: 6";Hand Hold: 1    Rocker Board 1 minute    SLS Rt LE on level surfaces 10 seconds x 5   with intermittent UE support   Other Standing Knee Exercises Balance Disc: x 2 minutes with intermittent UE support  Other Standing Knee Exercises side stepping x 15 each direction      Knee/Hip Exercises: Seated   Long Arc Quad Strengthening;Right;2 sets;10 reps;Weights    Long Arc Quad Weight 3 lbs.    Sit to Sand 10 reps;without UE support      Manual Therapy   Manual therapy comments PROM extension with overpressure                       PT Short Term Goals - 08/05/21 1203       PT SHORT TERM GOAL #1   Title Pt will be independent in her intial HEP.    Status On-going      PT SHORT TERM GOAL #2   Title Pt will improve her 5 time sit to stand to </= 14 seconds with no UE support    Status On-going               PT Long Term Goals - 07/10/21 1017       PT LONG TERM GOAL #1   Title Pt will be independent in her advanced HEP.    Time 6    Period Weeks    Status New    Target Date 08/23/21      PT LONG TERM GOAL #2   Title Pt will be able to amb community distances > 1000 feet with no device with pain </= 2/10 in right knee.     Baseline pt amb with mild antalgic gait    Time 6    Period Weeks    Status New    Target Date 08/23/21      PT LONG TERM GOAL #3   Title Pt will improve her right knee extension/ flexion arc to 0-125 degrees.    Baseline 2-112    Time 6    Period Weeks    Status New    Target Date 08/23/21      PT LONG TERM GOAL #4   Title Pt will be able to amb up and down 1 flight of stairs with no hand rail with pain </= 2/10.    Time 6    Period Weeks    Status New    Target Date 08/23/21      PT LONG TERM GOAL #5   Title Pt will improve her FOTO to >/= 72%    Baseline 69% on 07/10/2021    Time 6    Period Weeks    Status New    Target Date 08/23/21                   Plan - 08/05/21 1159     Clinical Impression Statement Pt arriving today reporting no pain in her right knee at rest. Pt did experience pain when placed on the recumbant bike. Pt tolerating exercises well with strengthening and dynamic balance. Continue skilled PT to maximize function.    Personal Factors and Comorbidities Comorbidity 3+    Comorbidities MH: anxiety, OA, fibromyalgia, lyme disease, SVT, HTN, kidney stones, Oseoporosis, depression, hearing loss, knee arthroscopy    Examination-Participation Restrictions Community Activity;Other;Shop    Stability/Clinical Decision Making Stable/Uncomplicated    Rehab Potential Good    PT Frequency 2x / week    PT Duration 6 weeks    PT Treatment/Interventions ADLs/Self Care Home Management;Cryotherapy;Electrical Stimulation;Gait training;Stair training;Functional mobility training;Therapeutic exercise;Balance training;Therapeutic activities;Patient/family education;Passive range of motion;Manual techniques;Dry needling;Taping;Neuromuscular re-education;Joint Manipulations;Vasopneumatic Device;Moist Heat    PT Next Visit  Plan Bike, leg press, knee extension, squats, dynamic balance, LAQ with weights, SLS activities    PT Home Exercise Plan Access Code: [email or web address]  URL:  [email or web address]  Date: 07/10/2021  Prepared by: Kearney Hard    Exercises  Supine Active Straight Leg Raise - 2-3 x daily - 7 x weekly - 2 sets - 10 reps  Long Sitting Quad Set with Towel Roll Under Heel - 2-3 x daily - 7 x weekly - 2 sets - 10 reps - 5 seconds hold  Sit to Stand with Counter Support - 2-3 x daily - 7 x weekly - 2 sets - 10 reps  Seated Long Arc Quad - 2-3 x daily - 7 x weekly - 2 sets - 10 reps - 5 seconds hold  Supine Heel Slide with Strap - 2 x daily - 7 x weekly - 2 sets - 10 reps - seconds hold  Standing Hip Abduction with Counter Support - 2 x daily - 7 x weekly - 2 sets - 10 reps    Consulted and Agree with Plan of Care Patient             Patient will benefit from skilled therapeutic intervention in order to improve the following deficits and impairments:  Pain, Difficulty walking, Decreased range of motion, Decreased strength, Increased edema, Decreased balance, Impaired flexibility  Visit Diagnosis: Difficulty in walking, not elsewhere classified  Muscle weakness (generalized)  Stiffness of right knee, not elsewhere classified  Acute pain of right knee  Localized edema     Problem List Patient Active Problem List   Diagnosis Date Noted   Status post total right knee replacement 02/18/2021   Preoperative evaluation to rule out surgical contraindication 11/23/2020   Episodic recurrent vertigo 10/04/2020   History of kidney stones 10/02/2020   Osteoporosis 10/02/2020   Sensorineural hearing loss 10/02/2020   Osteoarthritis of right knee 06/27/2020   SVT (supraventricular tachycardia) (HCC) 07/26/2019   Overweight (BMI 25.0-29.9) 07/26/2019   H/o Lyme disease     Oretha Caprice, PT, MPT 08/05/2021, 12:20 PM  [facility] [address] [place], [place], [address] Phone: [phone]   Fax:  [phone]  Name: VIANA SLEEP MRN: [id number] Date of Birth: [birth year]  PHYSICAL THERAPY  DISCHARGE SUMMARY  Visits from Start of Care: 2  Current functional level related to goals / functional outcomes: Pt stated she is beginning a gym program and is discontinuing her skilled PT visits.  See above   Remaining deficits: See above   Education / Equipment: HEP   Patient agrees to discharge. Patient goals were not met. Patient is being discharged due to not returning since the last visit.

## 2021-08-08 ENCOUNTER — Encounter: Payer: Medicare Other | Admitting: Physical Therapy

## 2021-08-08 ENCOUNTER — Encounter: Payer: Medicare Other | Admitting: Rehabilitative and Restorative Service Providers"

## 2021-08-12 ENCOUNTER — Encounter: Payer: Medicare Other | Admitting: Physical Therapy

## 2021-08-15 ENCOUNTER — Encounter: Payer: Medicare Other | Admitting: Rehabilitative and Restorative Service Providers"

## 2021-08-19 ENCOUNTER — Encounter: Payer: Medicare Other | Admitting: Physical Therapy

## 2021-08-21 ENCOUNTER — Encounter: Payer: Medicare Other | Admitting: Physical Therapy

## 2021-08-29 ENCOUNTER — Ambulatory Visit: Payer: Medicare Other | Admitting: Orthopaedic Surgery

## 2021-08-30 ENCOUNTER — Other Ambulatory Visit: Payer: Self-pay

## 2021-08-30 ENCOUNTER — Ambulatory Visit (INDEPENDENT_AMBULATORY_CARE_PROVIDER_SITE_OTHER): Payer: Medicare Other | Admitting: Orthopaedic Surgery

## 2021-08-30 ENCOUNTER — Encounter: Payer: Self-pay | Admitting: Orthopaedic Surgery

## 2021-08-30 ENCOUNTER — Ambulatory Visit (INDEPENDENT_AMBULATORY_CARE_PROVIDER_SITE_OTHER): Payer: Medicare Other

## 2021-08-30 DIAGNOSIS — M1711 Unilateral primary osteoarthritis, right knee: Secondary | ICD-10-CM

## 2021-08-30 DIAGNOSIS — Z96651 Presence of right artificial knee joint: Secondary | ICD-10-CM

## 2021-08-30 NOTE — Progress Notes (Signed)
Post-Op Visit Note   Patient: Jodi Mcgrath           Date of Birth: 1955-08-07           MRN: [id number] Visit Date: 08/30/2021 PCP: Billey Co, MD   Assessment & Plan:  Chief Complaint:  Chief Complaint  Patient presents with   Right Knee - Pain, Follow-up   Visit Diagnoses:  1. Primary osteoarthritis of right knee   2. Status post total right knee replacement     Plan: Patient is a pleasant 66 year old female who comes in today 6 months status post right total knee replacement 02/18/2021.  She has been doing great.  No complaints.  She has been working 2 and half miles a day without any trouble.  Examination of the right knee reveals range of motion from 0 to 125 degrees.  Stable valgus varus stress.  She is neurovascular tact distally.  At this point, she will continue to advance with activity as tolerated.  Dental prophylaxis reinforced.  Follow-up with [place] in 6 months time for repeat evaluation and 2 view x-rays of the right knee.  Call with concerns or questions in meantime.  Follow-Up Instructions: Return in about 6 months (around 02/27/2022).   Orders:  Orders Placed This Encounter  Procedures   XR Knee 1-2 Views Right   No orders of the defined types were placed in this encounter.   Imaging: XR Knee 1-2 Views Right  Result Date: 08/30/2021 Well-seated prosthesis without complication   PMFS History: Patient Active Problem List   Diagnosis Date Noted   Status post total right knee replacement 02/18/2021   Preoperative evaluation to rule out surgical contraindication 11/23/2020   Episodic recurrent vertigo 10/04/2020   History of kidney stones 10/02/2020   Osteoporosis 10/02/2020   Sensorineural hearing loss 10/02/2020   Osteoarthritis of right knee 06/27/2020   SVT (supraventricular tachycardia) (HCC) 07/26/2019   Overweight (BMI 25.0-29.9) 07/26/2019   H/o Lyme disease    Past Medical History:  Diagnosis Date   Anxiety    Depression     Fibromyalgia    H/o Lyme disease    History of kidney stones    Hypertension    Osteoarthritis    Osteoarthritis of right knee 06/27/2020   Osteoporosis    Overweight (BMI 25.0-29.9) 07/26/2019   Preoperative evaluation to rule out surgical contraindication 11/23/2020   Sensorineural hearing loss 10/02/2020   SVT (supraventricular tachycardia) (HCC)     Family History  Problem Relation Age of Onset   CAD Father        CABG in 68's   Non-Hodgkin's lymphoma Father    CAD Brother        CABG at 32 and SCD at 15   Diabetes Brother    Diabetes Brother     Past Surgical History:  Procedure Laterality Date   ABDOMINAL HYSTERECTOMY     CARDIAC ELECTROPHYSIOLOGY STUDY AND ABLATION     DILATION AND CURETTAGE, DIAGNOSTIC / THERAPEUTIC     KNEE CARTILAGE SURGERY     LITHOTRIPSY     TOTAL KNEE ARTHROPLASTY Right 02/18/2021   Procedure: RIGHT TOTAL KNEE ARTHROPLASTY;  Surgeon: Tarry Kos, MD;  Location: [facility] OR;  Service: Orthopedics;  Laterality: Right;   Social History   Occupational History   Not on file  Tobacco Use   Smoking status: Never   Smokeless tobacco: Never  Vaping Use   Vaping Use: Never used  Substance and Sexual Activity  Alcohol use: Never   Drug use: Never   Sexual activity: Not on file

## 2021-11-23 IMAGING — DX DG KNEE 1-2V PORT*R*
1 series · 2 of 2 positions shown · non-contrast
Comparison: 10/11/2020

CLINICAL DATA: Post right knee replacement

EXAM:
PORTABLE RIGHT KNEE - 1-2 VIEW

[Series 1: knee · 0.14mm/px · 2 of 2 slices shown]
[im 1/2]
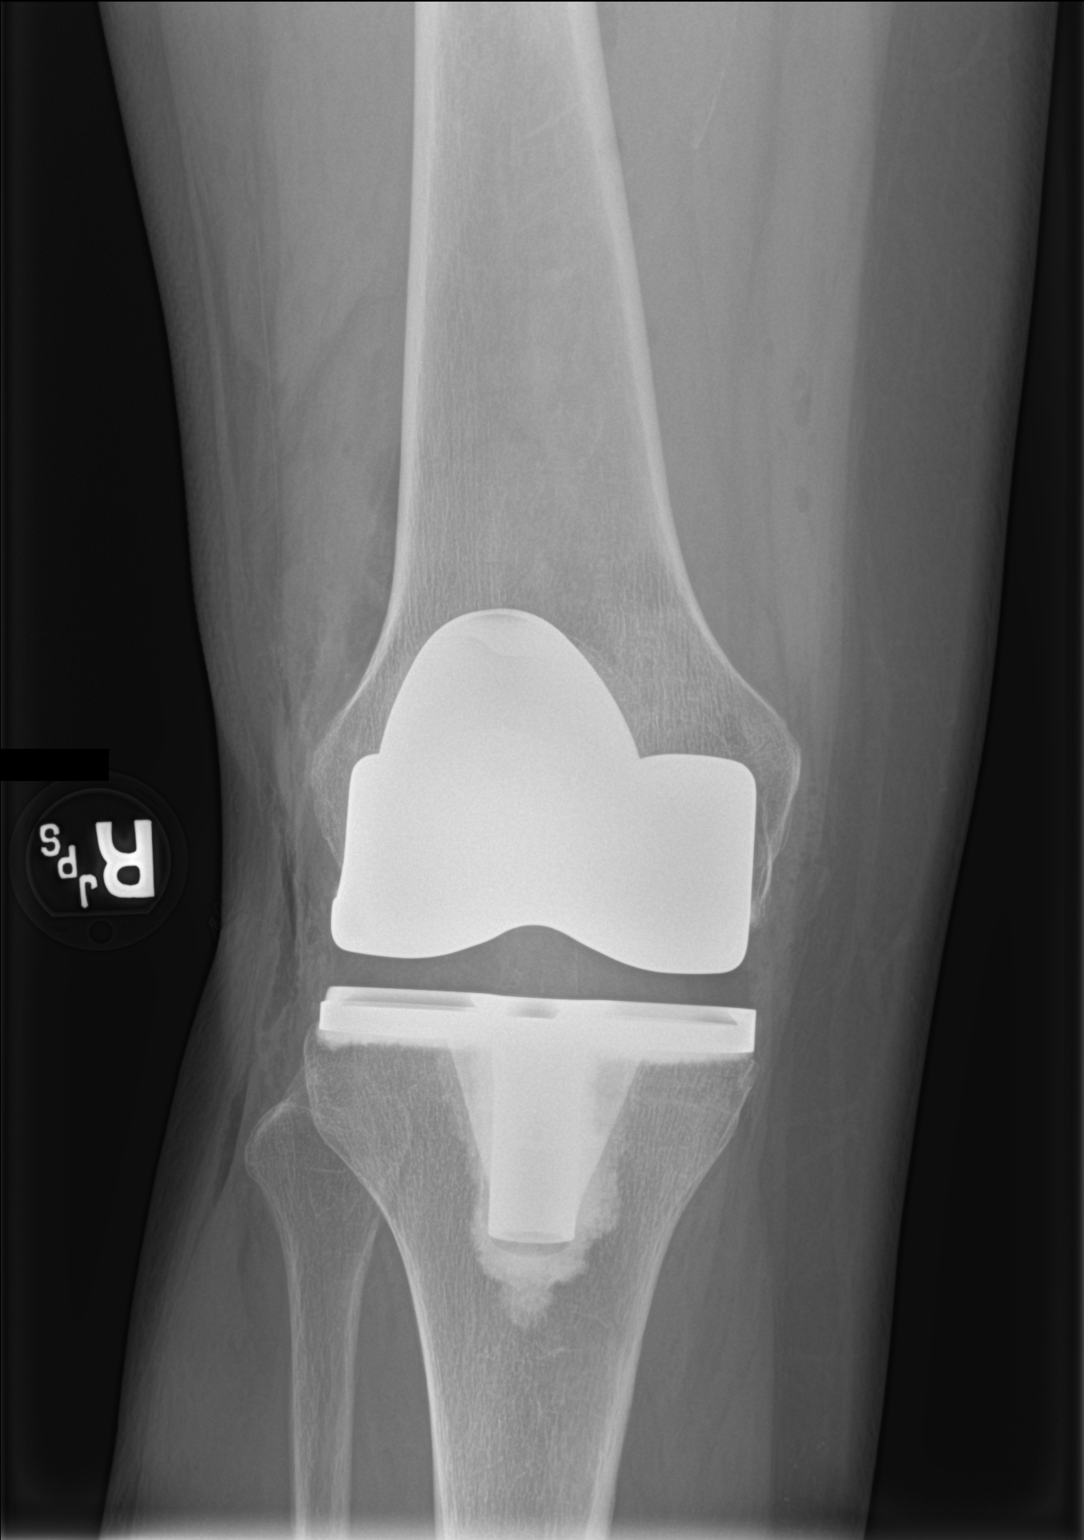
[im 2/2]
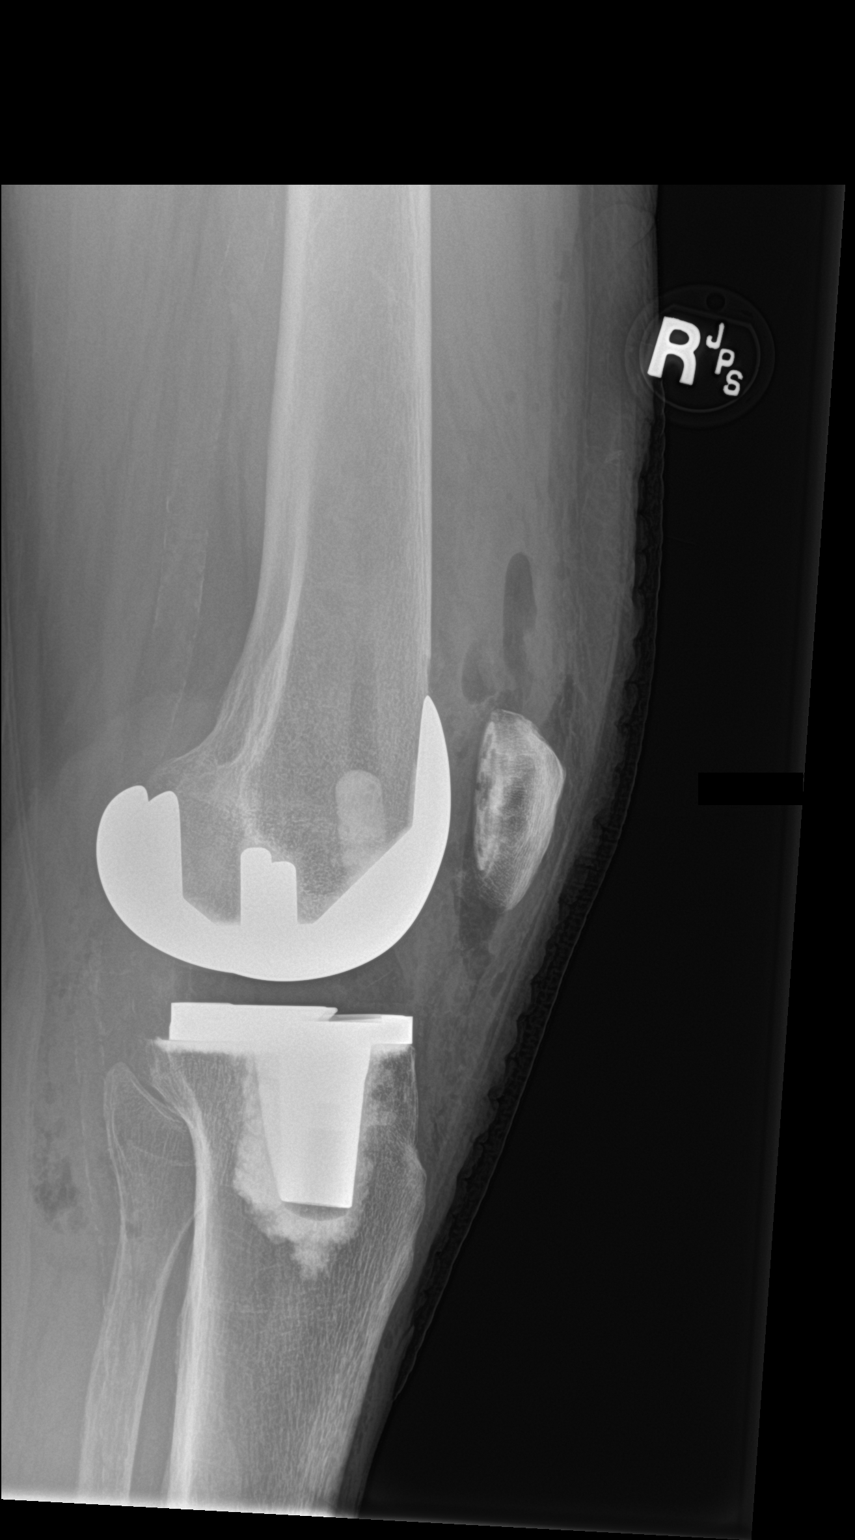

[2 of 2 positions shown; findings below may reference images not displayed]

FINDINGS: Changes of right knee replacement. Soft tissue and joint space gas.
No hardware or bony complicating feature.
IMPRESSION: Right knee replacement.  No visible complicating feature.

## 2021-12-24 ENCOUNTER — Encounter: Payer: Self-pay | Admitting: *Deleted

## 2022-01-22 ENCOUNTER — Other Ambulatory Visit: Payer: Self-pay | Admitting: Family Medicine

## 2022-01-22 DIAGNOSIS — I471 Supraventricular tachycardia: Secondary | ICD-10-CM

## 2022-01-23 ENCOUNTER — Encounter: Payer: Self-pay | Admitting: Family Medicine

## 2022-01-23 MED ORDER — METOPROLOL TARTRATE 25 MG PO TABS: 25.0000 mg | ORAL_TABLET | Freq: Every evening | ORAL | 2 refills | Status: DC

## 2022-03-06 NOTE — Progress Notes (Deleted)
    SUBJECTIVE:   CHIEF COMPLAINT / HPI:   Hair thinning-   PERTINENT  PMH / PSH: ***  OBJECTIVE:   There were no vitals taken for this visit.  ***  ASSESSMENT/PLAN:   No problem-specific Assessment & Plan notes found for this encounter.     Billey Co, MD [facility]

## 2022-03-20 ENCOUNTER — Ambulatory Visit: Payer: Medicare Other | Admitting: Family Medicine

## 2022-04-21 ENCOUNTER — Other Ambulatory Visit: Payer: Self-pay | Admitting: Family Medicine

## 2022-04-21 DIAGNOSIS — I471 Supraventricular tachycardia, unspecified: Secondary | ICD-10-CM
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
## 2022-09-26 ENCOUNTER — Ambulatory Visit (INDEPENDENT_AMBULATORY_CARE_PROVIDER_SITE_OTHER): Payer: Medicare Other | Admitting: Family Medicine

## 2022-09-26 ENCOUNTER — Encounter: Payer: Self-pay | Admitting: Family Medicine

## 2022-09-26 VITALS — BP 126/70 | HR 84 | Ht 66.0 in | Wt 185.0 lb

## 2022-09-26 DIAGNOSIS — R4589 Other symptoms and signs involving emotional state: Secondary | ICD-10-CM | POA: Diagnosis present

## 2022-09-26 DIAGNOSIS — Z1231 Encounter for screening mammogram for malignant neoplasm of breast: Secondary | ICD-10-CM

## 2022-09-26 DIAGNOSIS — H8102 Meniere's disease, left ear: Secondary | ICD-10-CM | POA: Diagnosis not present

## 2022-09-26 DIAGNOSIS — Z1211 Encounter for screening for malignant neoplasm of colon: Secondary | ICD-10-CM

## 2022-09-26 MED ORDER — DIAZEPAM 2 MG PO TABS: 2.0000 mg | ORAL_TABLET | Freq: Two times a day (BID) | ORAL | 0 refills | Status: DC | PRN

## 2022-09-26 MED ORDER — BUPROPION HCL ER (XL) 150 MG PO TB24: 150.0000 mg | ORAL_TABLET | Freq: Every day | ORAL | 1 refills | Status: DC

## 2022-09-26 NOTE — Assessment & Plan Note (Signed)
-   Meeting criteria for depression, discussed SSRIs versus Wellbutrin, due to her lack of anxiety, low energy and low mood do feel that Wellbutrin could be a good option for her she is agreeable to this and will start Wellbutrin 150 mg XL once daily, no contraindications - Discussed potential side effects including increased activation or anxiety, discussed if feeling like she might hurt herself or others to immediately give [place] a call - She should continue with her therapist and we will follow-up in 1 month

## 2022-09-26 NOTE — Patient Instructions (Addendum)
It was wonderful to see you today.  Please bring ALL of your medications with you to every visit.   Today we talked about:  We ordered a diagnostic mammogram for you today. You can call [facility] at [phone] to schedule this.   I ordered a referral for your colonoscopy- they will reach out to you.  We start Wellbutrin 150 mg daily, please call if causing anxiety, heart racing, or thoughts of wanting to hurt yourself or others. I have refilled your Valium for your meniere's spells as well.  We will see you in one month!  Thank you for choosing [facility].   Please call [phone] with any questions about today's appointment.  Please arrive at least 15 minutes prior to your scheduled appointments.   If you had blood work today, I will send you a MyChart message or a letter if results are normal. Otherwise, I will give you a call.   If you had a referral placed, they will call you to set up an appointment. Please give [place] a call if you don't hear back in the next 2 weeks.   If you need additional refills before your next appointment, please call your pharmacy first.   Yehuda Savannah, MD  Family Medicine

## 2022-09-26 NOTE — Assessment & Plan Note (Signed)
Given number to call GI breast center to schedule mammogram no masses or nipple discharge

## 2022-09-26 NOTE — Assessment & Plan Note (Signed)
Diagnosed with Mnire's disease at [facility], doing better from this, has as needed Valium for severe episodes, would like a refill for this and I think this is reasonable for me to refill as her [facility] specialist is far away and she does not go often, PDMP reviewed and refilled today

## 2022-09-26 NOTE — Assessment & Plan Note (Signed)
Referral for screening colonoscopy, did discuss FIT testing and she would prefer colonoscopy at this time, I do feel like she is relatively healthy and long life expectancy so screening colonoscopy is reasonable as she has never had one

## 2022-10-01 ENCOUNTER — Telehealth: Payer: Self-pay | Admitting: Orthopaedic Surgery

## 2022-10-01 ENCOUNTER — Other Ambulatory Visit: Payer: Self-pay | Admitting: Physician Assistant

## 2022-10-01 MED ORDER — AMOXICILLIN 500 MG PO CAPS: ORAL_CAPSULE | ORAL | 2 refills | Status: DC

## 2022-10-01 NOTE — Telephone Encounter (Signed)
Pt hs an upcoming dental appt Monday and need pre meds. Please send to [facility] [address]. Pt also asking for a letter to be faxed to her dental office stating she will need pre meds for 2 years. Pt has right knee replacement 02/18/2021. Dental Office name Dr. Ronnald Ramp and fax number is [phone].

## 2022-10-01 NOTE — Telephone Encounter (Signed)
sent 

## 2022-10-07 ENCOUNTER — Telehealth: Payer: Self-pay | Admitting: Family Medicine

## 2022-10-07 ENCOUNTER — Other Ambulatory Visit: Payer: Self-pay | Admitting: Family Medicine

## 2022-10-07 DIAGNOSIS — Z1231 Encounter for screening mammogram for malignant neoplasm of breast: Secondary | ICD-10-CM

## 2022-10-07 NOTE — Telephone Encounter (Signed)
Contacted Jodi Mcgrath to schedule their annual wellness visit. Appointment made for 10/16/2022.  Thank you,  [place] Direct dial  [phone]

## 2022-10-10 ENCOUNTER — Ambulatory Visit
Admission: RE | Admit: 2022-10-10 | Discharge: 2022-10-10 | Disposition: A | Discharge status: Home / Self Care | Payer: Medicare Other | Source: Ambulatory Visit | Attending: Family Medicine | Admitting: Family Medicine

## 2022-10-10 DIAGNOSIS — Z1231 Encounter for screening mammogram for malignant neoplasm of breast: Secondary | ICD-10-CM

## 2022-10-16 ENCOUNTER — Ambulatory Visit: Payer: Medicare Other

## 2022-10-20 ENCOUNTER — Ambulatory Visit (INDEPENDENT_AMBULATORY_CARE_PROVIDER_SITE_OTHER): Payer: Medicare Other | Admitting: Family Medicine

## 2022-10-20 ENCOUNTER — Encounter: Payer: Self-pay | Admitting: Family Medicine

## 2022-10-20 VITALS — BP 126/80 | HR 67 | Temp 98.4°F | Ht 66.0 in | Wt 179.0 lb

## 2022-10-20 DIAGNOSIS — M818 Other osteoporosis without current pathological fracture: Secondary | ICD-10-CM | POA: Diagnosis present

## 2022-10-20 DIAGNOSIS — R4589 Other symptoms and signs involving emotional state: Secondary | ICD-10-CM

## 2022-10-20 MED ORDER — BUPROPION HCL ER (XL) 150 MG PO TB24: 150.0000 mg | ORAL_TABLET | Freq: Every day | ORAL | 1 refills | Status: DC

## 2022-10-20 NOTE — Assessment & Plan Note (Signed)
Would like to do Prolia, last DEXA in 2021, will refer to Dr Valentina Lucks to discuss starting Prolia

## 2022-10-20 NOTE — Patient Instructions (Addendum)
It was wonderful to see you today.  Please bring ALL of your medications with you to every visit.   Today we talked about:  - Start taking Wellbutrin 2 tabs daily in the morning, and lets check in in a week  Please make a follow up with Dr Valentina Lucks our pharmacist to discuss Prolia.  Thank you for choosing [facility].   Please call [phone] with any questions about today's appointment.  Please arrive at least 15 minutes prior to your scheduled appointments.   If you had blood work today, I will send you a MyChart message or a letter if results are normal. Otherwise, I will give you a call.   If you had a referral placed, they will call you to set up an appointment. Please give [place] a call if you don't hear back in the next 2 weeks.   If you need additional refills before your next appointment, please call your pharmacy first.   Yehuda Savannah, MD  Family Medicine

## 2022-10-20 NOTE — Assessment & Plan Note (Signed)
Increase Wellbutrin to 300mg  XL daily, will message in 1-2 weeks if feeling improved and wanting script for this dose Discussed to call if side effects

## 2022-10-20 NOTE — Progress Notes (Signed)
    SUBJECTIVE:   CHIEF COMPLAINT / HPI:   Follow up depression- started on wellbutrin 150 mg XL last visit. Feels like it has helped some. Notices some headaches the past few days but not since starting. No nausea, vomiting. Some insomnia but had that prior to starting wellbutrin. Interested in increasing dose. Noticed she was less anxious at dentist this past month. No SI.  Osteoporosis- on DEXA in 2021. No history of fragility fracture. Not taking calcium or vitamin D. Doesn't want to do bisphosphenates with h/o poor dentition but open to Prolia and would like to be able to do something at our office.  PERTINENT  PMH / PSH: SVT, Meniere's disease, sensorineural hearing loss, osteoporosis  OBJECTIVE:   BP 126/80   Pulse 67   Temp 98.4 F (36.9 C)   Ht 5\' 6"  (1.676 m)   Wt 179 lb (81.2 kg)   SpO2 97%   BMI 28.89 kg/m   General: alert & oriented, no apparent distress, well groomed HEENT: normocephalic, atraumatic, EOM grossly intact, oral mucosa moist, neck supple Respiratory: normal respiratory effort GI: non-distended Skin: no rashes, no jaundice Psych: appropriate mood and affect   ASSESSMENT/PLAN:   Osteoporosis Would like to do Prolia, last DEXA in 2021, will refer to Dr Valentina Lucks to discuss starting Prolia  Depressed mood Increase Wellbutrin to 300mg  XL daily, will message in 1-2 weeks if feeling improved and wanting script for this dose Discussed to call if side effects     Lenoria Chime, MD [place]

## 2022-11-05 ENCOUNTER — Encounter: Payer: Self-pay | Admitting: Family Medicine

## 2022-11-05 ENCOUNTER — Telehealth: Payer: Self-pay | Admitting: Family Medicine

## 2022-11-05 NOTE — Telephone Encounter (Signed)
Patient sent mychart message noting partner has extreme shortness of breath with simple walking. I left VM recommending he be seen urgently at emergency room based on those symptoms.  MyChart message sent as well.  Burley Saver MD

## 2022-11-10 ENCOUNTER — Encounter: Payer: Self-pay | Admitting: Pharmacist

## 2022-11-10 ENCOUNTER — Encounter: Payer: Self-pay | Admitting: Family Medicine

## 2022-11-10 ENCOUNTER — Ambulatory Visit (INDEPENDENT_AMBULATORY_CARE_PROVIDER_SITE_OTHER): Payer: Medicare Other | Admitting: Pharmacist

## 2022-11-10 VITALS — BP 135/72 | HR 60 | Wt 175.2 lb

## 2022-11-10 DIAGNOSIS — M810 Age-related osteoporosis without current pathological fracture: Secondary | ICD-10-CM | POA: Diagnosis not present

## 2022-11-10 MED ORDER — DENOSUMAB 60 MG/ML ~~LOC~~ SOSY: 60.0000 mg | PREFILLED_SYRINGE | Freq: Once | SUBCUTANEOUS | 0 refills | Status: AC

## 2022-11-10 MED ORDER — CALCIUM CITRATE + D 250-5 MG-MCG PO TABS: 1.0000 | ORAL_TABLET | Freq: Every day | ORAL | Status: AC

## 2022-11-10 MED ORDER — CALCIUM CITRATE 950 (200 CA) MG PO TABS: 200.0000 mg | ORAL_TABLET | Freq: Every day | ORAL | Status: DC

## 2022-11-10 NOTE — Patient Instructions (Signed)
It was nice to see you today!  Medication Changes:  Begin Prolia (denosumab) 60 mg every 6 months  Begin Citracal 200 mg daily   Follow-up with [place] in one week to get your first Prolia injection.   Keep up the good work with diet and exercise. Aim for a diet full of vegetables, fruit and lean meats (chicken, [place], fish). Try to limit salt intake by eating fresh or frozen vegetables (instead of canned), rinse canned vegetables prior to cooking and do not add any additional salt to meals.

## 2022-11-10 NOTE — Assessment & Plan Note (Signed)
Patient with osteoporosis requiring treatment. Calcium 9.3 from 02/2021. Last DXA lumbar T Score -2.5 from 07/2019.  -Started Prolia (denosumab) 60 mg every 6 months. Patient educated on purpose, proper use and potential adverse effects of osteonecrosis of jaw and atypical fracture.    -Started Citracal (calcium citrate + vitamin D) 250-5 mg in the evening/bedtime.  -Encouraged continued use of daily multivitamin in the AM. Encouraged patient to check amount of calcium and vitamin D on medication label.  -Patient educated on purpose, proper use, and potential adverse effects of Prolia.  -F/u labs ordered - CMET  -Discussed possible order for new DXA to obtain new baseline T-Scores.  Defer to PCP, Dr. Miquel Dunn -Counseled on proper calcium and vitamin d intake. Encouraged weight bearing exercise to aid in the improvement in bone density.

## 2022-11-10 NOTE — Progress Notes (Signed)
S:     Chief Complaint  Patient presents with   Medication Management    Osteoporosis   67 y.o. female who presents for hypertension evaluation, education, and management.  PMH is significant for SVT, Meniere's disease, sensorineural hearing loss, osteoporosis.  Patient was referred and last seen by Primary Care Provider, Dr. Miquel Dunn, on 10/20/22.   At last visit, discussed treatment for osteoporosis with Dr. Miquel Dunn. Deferred from bisphosphonates due to hx of poor dentition.   Today, patient arrives in spirits and presents without assistance.   Patient reports fracture from ankle joint in 2002 and from 3 years ago. No fragility fractures reported. Reports losing about 1.5 inches of her height over the course of her life. DXA from 2021 L1-L4 T score -2.5.  FRAX Score - Major osteoporotic = 14% in next 10 years; and Hip fracture 3.5% in the next ten years.   Family/Social history: Mother had hip fracture in her 19s. Her mother lost 2 inches of height. She was never on any osteoporosis agents.   Medication adherence good. Not taking calcium or vitamin D supplements     Patient reported dietary habits:   Is on weight watchers: Has 1/4 cup of milk in the AM and regular yogurt at lunch. Sometimes has a tablespoon of feta cheese. Obtaining roughly 800 mg calcium daily with current diet. Also takes a multivitamin daily. Daily dietary goal for calcium = 1200 mg. Daily dietary vitamin d goal = [phone] units.     O:  Review of Systems  Constitutional: Negative.   All other systems reviewed and are negative.   Physical Exam Vitals reviewed.  Constitutional:      Appearance: Normal appearance.  Musculoskeletal:        General: Normal range of motion.  Neurological:     Mental Status: She is alert and oriented to person, place, and time. Mental status is at baseline.  Psychiatric:        Mood and Affect: Mood normal.        Behavior: Behavior normal.        Thought Content: Thought  content normal.        Judgment: Judgment normal.     Last 3 Office BP readings: BP Readings from Last 3 Encounters:  10/20/22 126/80  09/26/22 126/70  02/19/21 128/71    BMET    Component Value Date/Time   NA 135 02/19/2021 0111   NA 140 11/23/2020 1106   K 4.6 02/19/2021 0111   CL 102 02/19/2021 0111   CO2 23 02/19/2021 0111   GLUCOSE 168 (H) 02/19/2021 0111   BUN 18 02/19/2021 0111   BUN 16 11/23/2020 1106   CREATININE 1.00 02/19/2021 0111   CALCIUM 9.3 02/19/2021 0111   GFRNONAA >60 02/19/2021 0111    Most recent Calcium:   9.3 on 02/19/2021   A/P: Patient with osteoporosis requiring treatment. Calcium 9.3 from 02/2021. Last DXA lumbar T Score -2.5 from 07/2019.  -Started Prolia (denosumab) 60 mg every 6 months. Patient educated on purpose, proper use and potential adverse effects of osteonecrosis of jaw and atypical fracture.    -Started Citracal (calcium citrate + vitamin D) 250-5 mg in the evening/bedtime.  -Encouraged continued use of daily multivitamin in the AM. Encouraged patient to check amount of calcium and vitamin D on medication label.  -Patient educated on purpose, proper use, and potential adverse effects of Prolia.  -F/u labs ordered - CMET  -Discussed possible order for new DXA to obtain new  baseline T-Scores.  Defer to PCP, Dr. Miquel Dunn -Counseled on proper calcium and vitamin d intake. Encouraged weight bearing exercise to aid in the improvement in bone density.    Results reviewed and written information provided.   Patient verbalized understanding of treatment plan.  Total time in face to face counseling 30 minutes.    Follow-up:  Clinic visit on 11/24/22 for first Prolia infusion.  Patient seen with Jerry Caras, PharmD PGY-1 Pharmacy Resident and Revonda Standard, PharmD Candidate.

## 2022-11-11 LAB — COMPREHENSIVE METABOLIC PANEL
ALT: 14 IU/L (ref 0–32)
AST: 18 IU/L (ref 0–40)
Albumin/Globulin Ratio: 2 (ref 1.2–2.2)
Albumin: 4.4 g/dL (ref 3.9–4.9)
Alkaline Phosphatase: 75 IU/L (ref 44–121)
BUN/Creatinine Ratio: 18 (ref 12–28)
BUN: 16 mg/dL (ref 8–27)
Bilirubin Total: 0.3 mg/dL (ref 0.0–1.2)
CO2: 21 mmol/L (ref 20–29)
Calcium: 9.1 mg/dL (ref 8.7–10.3)
Chloride: 101 mmol/L (ref 96–106)
Creatinine, Ser: 0.87 mg/dL (ref 0.57–1.00)
Globulin, Total: 2.2 g/dL (ref 1.5–4.5)
Glucose: 79 mg/dL (ref 70–99)
Potassium: 4 mmol/L (ref 3.5–5.2)
Sodium: 140 mmol/L (ref 134–144)
Total Protein: 6.6 g/dL (ref 6.0–8.5)
eGFR: 72 mL/min/{1.73_m2} (ref >59)

## 2022-11-24 ENCOUNTER — Ambulatory Visit: Payer: Medicare Other | Admitting: Family Medicine

## 2022-11-27 ENCOUNTER — Telehealth: Payer: Self-pay

## 2022-11-27 ENCOUNTER — Other Ambulatory Visit (HOSPITAL_COMMUNITY): Payer: Self-pay

## 2022-11-27 NOTE — Telephone Encounter (Signed)
Prolia VOB initiated via [email or web address] 

## 2022-11-28 NOTE — Telephone Encounter (Signed)
Pt ready for scheduling for Prolia on or after : 11/28/22  Out-of-pocket cost due at time of visit: $0  Primary: Medicare Prolia co-insurance: 0% Admin fee co-insurance: 0%  Secondary: [employer] Prolia co-insurance:  Admin fee co-insurance:   Medical Benefit Details: Date Benefits were checked: 11/28/22 Deductible: $240 met of $240 required/ Coinsurance: 0%/ Admin Fee: 0%  Prior Auth: N/A PA# Expiration Date:    Pharmacy benefit: Copay $--- If patient wants fill through the pharmacy benefit please send prescription to:  --- , and include estimated need by date in rx notes. Pharmacy will ship medication directly to the office.  Patient NOT eligible for Prolia Copay Card. Copay Card can make patient's cost as little as $25. Link to apply: [email or web address]://[email or web address]  ** This summary of benefits is an estimation of the patient's out-of-pocket cost. Exact cost may very based on individual plan coverage.

## 2022-12-09 ENCOUNTER — Other Ambulatory Visit (HOSPITAL_COMMUNITY): Payer: Self-pay

## 2022-12-10 ENCOUNTER — Ambulatory Visit (INDEPENDENT_AMBULATORY_CARE_PROVIDER_SITE_OTHER): Payer: Medicare Other | Admitting: Orthopaedic Surgery

## 2022-12-10 ENCOUNTER — Other Ambulatory Visit (INDEPENDENT_AMBULATORY_CARE_PROVIDER_SITE_OTHER): Payer: Medicare Other

## 2022-12-10 DIAGNOSIS — Z96651 Presence of right artificial knee joint: Secondary | ICD-10-CM

## 2022-12-10 NOTE — Progress Notes (Signed)
Post-Op Visit Note   Patient: Jodi Mcgrath           Date of Birth: 08-15-1955           MRN: [id number] Visit Date: 12/10/2022 PCP: Billey Co, MD   Assessment & Plan:  Chief Complaint:  Chief Complaint  Patient presents with   Right Knee - Routine Post Op   Visit Diagnoses:  1. Status post total right knee replacement     Plan: Patient is a pleasant 67 year old female who comes in today approximately 1-1/2-year status post right total knee replacement 02/18/2021.  She was doing well until recently.  She denies any new injury but notes that she has had pain along the medial joint on when she is moving her knee in certain directions such as when she is playing basketball.  She also feels as though there is a band across her knee.  Examination of the right knee reveals range of motion 0 to 125 degrees.  She is stable to valgus varus stress.  She is tender along the medial joint line.  No evidence of infection.  No mechanical sensation with movement of the knee.  She is neurovascular intact distally.  At this point, do not see anything untoward on x-ray and feel that she may be symptomatic from some inflamed scar tissue.  Recommend voltaren gel, activity modification.  Would recommend using steroid injection as a last resort.  Reassurance provided that the implant looks good.    Follow-Up Instructions: Return if symptoms worsen or fail to improve.   Orders:  Orders Placed This Encounter  Procedures   XR KNEE 3 VIEW RIGHT   No orders of the defined types were placed in this encounter.   Imaging: No results found.  PMFS History: Patient Active Problem List   Diagnosis Date Noted   Depressed mood 09/26/2022   Status post total right knee replacement 02/18/2021   Meniere's disease 10/04/2020   History of kidney stones 10/02/2020   Osteoporosis 10/02/2020   Sensorineural hearing loss 10/02/2020   Osteoarthritis of right knee 06/27/2020   SVT (supraventricular  tachycardia) 07/26/2019   Overweight (BMI 25.0-29.9) 07/26/2019   H/o Lyme disease    Past Medical History:  Diagnosis Date   Anxiety    Depression    Fibromyalgia    H/o Lyme disease    History of kidney stones    Hypertension    Osteoarthritis    Osteoarthritis of right knee 06/27/2020   Osteoporosis    Overweight (BMI 25.0-29.9) 07/26/2019   Preoperative evaluation to rule out surgical contraindication 11/23/2020   Sensorineural hearing loss 10/02/2020   SVT (supraventricular tachycardia)     Family History  Problem Relation Age of Onset   CAD Father        CABG in 9's   Non-Hodgkin's lymphoma Father    CAD Brother        CABG at 22 and SCD at 59   Diabetes Brother    Diabetes Brother     Past Surgical History:  Procedure Laterality Date   ABDOMINAL HYSTERECTOMY     CARDIAC ELECTROPHYSIOLOGY STUDY AND ABLATION     DILATION AND CURETTAGE, DIAGNOSTIC / THERAPEUTIC     KNEE CARTILAGE SURGERY     LITHOTRIPSY     TOTAL KNEE ARTHROPLASTY Right 02/18/2021   Procedure: RIGHT TOTAL KNEE ARTHROPLASTY;  Surgeon: Tarry Kos, MD;  Location: [facility] OR;  Service: Orthopedics;  Laterality: Right;   Social History  Occupational History   Not on file  Tobacco Use   Smoking status: Never   Smokeless tobacco: Never  Vaping Use   Vaping Use: Never used  Substance and Sexual Activity   Alcohol use: Never   Drug use: Never   Sexual activity: Not on file

## 2023-04-07 ENCOUNTER — Telehealth: Payer: Self-pay

## 2023-04-07 ENCOUNTER — Telehealth: Payer: Self-pay | Admitting: Orthopaedic Surgery

## 2023-04-07 NOTE — Telephone Encounter (Signed)
Patient called needing clearance for dental work and would like for you to fax it to [phone]

## 2023-04-07 NOTE — Telephone Encounter (Signed)
We only recommend for two years after total knee/hip replacement and it looks like she is now over two years out from surgery

## 2023-04-07 NOTE — Telephone Encounter (Signed)
Jodi Mcgrath with Dr. Elder Cyphers dentist office would like premedication note/letter faxed to [phone].  Cb# [phone].  Please advise.  Thank you.

## 2023-04-10 NOTE — Telephone Encounter (Signed)
Letter has been faxed to number provided below.

## 2023-05-04 ENCOUNTER — Ambulatory Visit: Payer: Medicare Other | Admitting: Family Medicine

## 2023-05-04 NOTE — Progress Notes (Deleted)
    SUBJECTIVE:   CHIEF COMPLAINT / HPI:   Depressed mood- stopped Wellbutrin due to headaches.   Osteoporosis- taking calcium/vit D supplements? Sstarted on Prolia, repeat DXA scan?  HCM Colonoscopy Pneumonia vaccine? Covid flu vaccine? Hep C screening? Medicare AWV- does she want one?  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  ***  ASSESSMENT/PLAN:   No problem-specific Assessment & Plan notes found for this encounter.     Billey Co, MD [facility]

## 2023-07-13 ENCOUNTER — Other Ambulatory Visit: Payer: Self-pay | Admitting: Family Medicine

## 2023-07-13 DIAGNOSIS — I471 Supraventricular tachycardia, unspecified: Secondary | ICD-10-CM

## 2023-07-13 NOTE — Telephone Encounter (Signed)
Has been on daily (?) tartrate for 2 years--refilled  Terisa Starr, MD  [facility] Medicine Teaching Service

## 2024-01-01 ENCOUNTER — Ambulatory Visit (INDEPENDENT_AMBULATORY_CARE_PROVIDER_SITE_OTHER): Admitting: Orthopaedic Surgery

## 2024-01-01 ENCOUNTER — Other Ambulatory Visit (INDEPENDENT_AMBULATORY_CARE_PROVIDER_SITE_OTHER): Payer: Self-pay

## 2024-01-01 DIAGNOSIS — Z96651 Presence of right artificial knee joint: Secondary | ICD-10-CM

## 2024-01-01 NOTE — Progress Notes (Signed)
 Office Visit Note   Patient: Jodi Mcgrath           Date of Birth: [birth year]           MRN: [id number] Visit Date: 01/01/2024              Requested by: Charmel Cooter, MD [address] [place],  [place] [address] PCP: Charmel Cooter, MD   Assessment & Plan: Visit Diagnoses:  1. Status post total right knee replacement     Plan: History of Present Illness Jodi Mcgrath is a 68 year old female with a history of right knee replacement who presents with persistent knee tightness and pain.  She experiences persistent tightness in her knee, described as a 'band' around the area, with occasional sharp pain on the side, particularly noticeable when touched. Symptoms have worsened since May after a long walk on a different surface with inadequate footwear. Advil and icing provide some relief, although tightness persists. Symptoms flare up with activity, accompanied by a clicking sensation. She has had difficulty straightening her knee since her first surgery 18 years ago, but now she can straighten it well. New shoes have helped alleviate some symptoms. No current effusion in the knee.  Physical Exam MUSCULOSKELETAL: No effusion in the right knee. Scar is fully healed. Knee extension good. Knee flexion approximately 105 degrees.  Pain in the medial retinaculum.  Assessment and Plan Right knee pain status post-replacement Chronic pain post-knee replacement likely due to activity related swelling and scar tissue impingement. Implant well-fixed, no damage. Conservative management preferred. Discussed surgical scar tissue removal if symptoms worsen. Explained surgical risks including scar tissue formation and 4-6 week recovery. - Apply ice and compression. - Take ibuprofen or naproxen as needed. - Perform quadriceps strengthening exercises. - Adjust activity based on symptoms. - Consider surgery if symptoms worsen.  Follow-Up Instructions: No follow-ups on file.   Orders:   Orders Placed This Encounter  Procedures   XR Knee 1-2 Views Right   No orders of the defined types were placed in this encounter.     Subjective: Chief Complaint  Patient presents with   Right Knee - Pain    HPI  Review of Systems  Constitutional: Negative.   HENT: Negative.    Eyes: Negative.   Respiratory: Negative.    Cardiovascular: Negative.   Endocrine: Negative.   Musculoskeletal: Negative.   Neurological: Negative.   Hematological: Negative.   Psychiatric/Behavioral: Negative.    All other systems reviewed and are negative.    Objective: Vital Signs: There were no vitals taken for this visit.  Physical Exam Vitals and nursing note reviewed.  Constitutional:      Appearance: She is well-developed.  HENT:     Head: Atraumatic.     Nose: Nose normal.   Eyes:     Extraocular Movements: Extraocular movements intact.    Cardiovascular:     Pulses: Normal pulses.  Pulmonary:     Effort: Pulmonary effort is normal.  Abdominal:     Palpations: Abdomen is soft.   Musculoskeletal:     Cervical back: Neck supple.   Skin:    General: Skin is warm.     Capillary Refill: Capillary refill takes less than 2 seconds.   Neurological:     Mental Status: She is alert. Mental status is at baseline.   Psychiatric:        Behavior: Behavior normal.        Thought Content: Thought  content normal.        Judgment: Judgment normal.     Ortho Exam  Specialty Comments:  No specialty comments available.  Imaging: XR Knee 1-2 Views Right Result Date: 01/01/2024 No acute or structural abnormalities. X-rays of the right knee demonstrate a stable right total knee replacement in good alignment     PMFS History: Patient Active Problem List   Diagnosis Date Noted   Depressed mood 09/26/2022   Status post total right knee replacement 02/18/2021   Meniere's disease 10/04/2020   History of kidney stones 10/02/2020   Osteoporosis 10/02/2020   Sensorineural  hearing loss 10/02/2020   Osteoarthritis of right knee 06/27/2020   SVT (supraventricular tachycardia) (HCC) 07/26/2019   Overweight (BMI 25.0-29.9) 07/26/2019   H/o Lyme disease    Past Medical History:  Diagnosis Date   Anxiety    Depression    Fibromyalgia    H/o Lyme disease    History of kidney stones    Hypertension    Osteoarthritis    Osteoarthritis of right knee 06/27/2020   Osteoporosis    Overweight (BMI 25.0-29.9) 07/26/2019   Preoperative evaluation to rule out surgical contraindication 11/23/2020   Sensorineural hearing loss 10/02/2020   SVT (supraventricular tachycardia)     Family History  Problem Relation Age of Onset   CAD Father        CABG in 54's   Non-Hodgkin's lymphoma Father    CAD Brother        CABG at 37 and SCD at 59   Diabetes Brother    Diabetes Brother     Past Surgical History:  Procedure Laterality Date   ABDOMINAL HYSTERECTOMY     CARDIAC ELECTROPHYSIOLOGY STUDY AND ABLATION     DILATION AND CURETTAGE, DIAGNOSTIC / THERAPEUTIC     KNEE CARTILAGE SURGERY     LITHOTRIPSY     TOTAL KNEE ARTHROPLASTY Right 02/18/2021   Procedure: RIGHT TOTAL KNEE ARTHROPLASTY;  Surgeon: Wes Hamman, MD;  Location: [facility] OR;  Service: Orthopedics;  Laterality: Right;   Social History   Occupational History   Not on file  Tobacco Use   Smoking status: Never   Smokeless tobacco: Never  Vaping Use   Vaping status: Never Used  Substance and Sexual Activity   Alcohol use: Never   Drug use: Never   Sexual activity: Not on file

## 2024-01-20 ENCOUNTER — Other Ambulatory Visit: Payer: Self-pay | Admitting: Family Medicine

## 2024-03-22 ENCOUNTER — Other Ambulatory Visit: Payer: Self-pay | Admitting: Family Medicine

## 2024-03-22 DIAGNOSIS — Z1231 Encounter for screening mammogram for malignant neoplasm of breast: Secondary | ICD-10-CM

## 2024-04-06 ENCOUNTER — Ambulatory Visit
Admission: RE | Admit: 2024-04-06 | Discharge: 2024-04-06 | Disposition: A | Discharge status: Home / Self Care | Source: Ambulatory Visit | Attending: Family Medicine

## 2024-04-06 DIAGNOSIS — Z1231 Encounter for screening mammogram for malignant neoplasm of breast: Secondary | ICD-10-CM

## 2024-04-07 ENCOUNTER — Other Ambulatory Visit: Payer: Self-pay

## 2024-04-07 DIAGNOSIS — I471 Supraventricular tachycardia, unspecified: Secondary | ICD-10-CM

## 2024-04-07 MED ORDER — METOPROLOL TARTRATE 25 MG PO TABS: 25.0000 mg | ORAL_TABLET | Freq: Every evening | ORAL | 2 refills | Status: AC

## 2024-04-08 ENCOUNTER — Ambulatory Visit: Payer: Self-pay | Admitting: Family Medicine

## 2024-05-09 ENCOUNTER — Other Ambulatory Visit: Payer: Self-pay | Admitting: Family Medicine

## 2024-05-20 ENCOUNTER — Encounter (INDEPENDENT_AMBULATORY_CARE_PROVIDER_SITE_OTHER): Payer: Self-pay

## 2024-05-23 ENCOUNTER — Encounter: Payer: Self-pay | Admitting: Radiology

## 2024-06-13 ENCOUNTER — Ambulatory Visit: Admitting: Family Medicine

## 2024-06-13 ENCOUNTER — Ambulatory Visit: Payer: Self-pay | Admitting: Family Medicine

## 2024-06-13 VITALS — BP 142/76 | HR 85 | Ht 66.0 in | Wt 182.8 lb

## 2024-06-13 DIAGNOSIS — E663 Overweight: Secondary | ICD-10-CM

## 2024-06-13 DIAGNOSIS — G609 Hereditary and idiopathic neuropathy, unspecified: Secondary | ICD-10-CM

## 2024-06-13 DIAGNOSIS — Z23 Encounter for immunization: Secondary | ICD-10-CM

## 2024-06-13 DIAGNOSIS — Z1211 Encounter for screening for malignant neoplasm of colon: Secondary | ICD-10-CM

## 2024-06-13 DIAGNOSIS — Z833 Family history of diabetes mellitus: Secondary | ICD-10-CM

## 2024-06-13 DIAGNOSIS — M810 Age-related osteoporosis without current pathological fracture: Secondary | ICD-10-CM

## 2024-06-13 DIAGNOSIS — E74819 Disorders of glucose transport, unspecified: Secondary | ICD-10-CM | POA: Diagnosis not present

## 2024-06-13 LAB — POCT GLYCOSYLATED HEMOGLOBIN (HGB A1C): Hemoglobin A1C: 5.8 % — AB (ref 4.0–5.6)

## 2024-06-13 NOTE — Assessment & Plan Note (Signed)
 Discussed healthy weight and wellness referral, she willconsider

## 2024-06-13 NOTE — Patient Instructions (Addendum)
 It was wonderful to see you today.  Please bring ALL of your medications with you to every visit.   Today we talked about:  - Sertraline (Zoloft) - look into this medications for mood  Check some labs today for your nerves and hair.   Please bring back the stool card.  Pneumonia shot!  Thank you for choosing [facility].   Please call [phone] with any questions about today's appointment.  Please arrive at least 15 minutes prior to your scheduled appointments.   If you had blood work today, I will send you a MyChart message or a letter if results are normal. Otherwise, I will give you a call.   If you had a referral placed, they will call you to set up an appointment. Please give us  a call if you don't hear back in the next 2 weeks.   If you need additional refills before your next appointment, please call your pharmacy first.  Don't forget to check out the [facility] in the [facility] at [address] [phone] Affordable prices on prescriptions and over-the-counter items, as well as services like vaccinations and medication home delivery.   Rollene Keeling, MD  Family Medicine

## 2024-06-13 NOTE — Progress Notes (Signed)
    SUBJECTIVE:   CHIEF COMPLAINT / HPI:   Discussed the use of AI scribe software for clinical note transcription with the patient, who gave verbal consent to proceed.  History of Present Illness Jodi Mcgrath is a 68 year old female who presents with numbness and itching in her toes and feet.  Peripheral neuropathy and pruritus - Numbness affects two toes, progressively worsening - Intermittent itching without visible rash, extending up the foot and occasionally affecting both feet - Sensations of 'pins and needles' present  Balance disturbance and vertigo - History of Meniere's disease with chronic balance issues - Recent fall in the bathroom - Current medications include betahistine 24 mg twice daily for vertigo, Valium  as needed, and Zofran  for nausea  Alopecia - Significant hair thinning - Prenatal vitamins taken without improvement - Previous use of Rogaine without benefit  Weight gain and metabolic concerns - Family history of diabetes - Weight increased by 6-8 pounds over the past year, predominantly in the abdominal region  Mood variability and psychosocial support - Mood fluctuates with periods of feeling well and overwhelmed - Engages in therapy, Al-Anon meetings, and church for support - No thoughts of self-harm  Osteoporosis and bone health - History of osteoporosis - Previously sought Prolia  injections, not covered by Medicare - No recent bone scan performed - Not currently taking calcium  or vitamin D  supplements   Colonoscopy- is due. No blood in stool. Willing to do FIT test.  PERTINENT  PMH / PSH: SVT, Meniere's disease, osteoporosis, s/p total R knee replacement  OBJECTIVE:   BP (!) 142/76   Pulse 85   Ht 5' 6 (1.676 m)   Wt 182 lb 12.8 oz (82.9 kg)   SpO2 100%   BMI 29.50 kg/m   Physical Exam VITALS: BP- 142/70 MEASUREMENTS: Weight- 182. EXTREMITIES: Pulses present, good color, normal range of motion. Dermatologic Exam: Nails: No  onchomycosis. No nail bed thickening. Callouses: No callouses. No fissures. Web spaces: No macerations or open lesions Redness/Erythema: None  Musculoskeletal Exam: No bunion, hammertoes, prominent metatarsals, collapsed arch, or previous amputation. Vascular Assessment: Pedal hair growth present. 2+ posterior tibial and dorsalis pedis pulses.   ASSESSMENT/PLAN:   Assessment & Plan Idiopathic peripheral neuropathy Itching/tingling sensation in bilateral feet A1c 5.8, will check other labwork that can contribute to neuropathy - Recommended over-the-counter capsaicin or menthol  cream for symptomatic relief. Overweight (BMI 25.0-29.9) Discussed healthy weight and wellness referral, she willconsider Family history of diabetes mellitus A1c in prediabetes range today Screen for colon cancer FIT testing today Osteoporosis, unspecified osteoporosis type, unspecified pathological fracture presence Will send message to Dr Koval to discuss Prolia  injections Recheck calcium  today - Encouraged weight-bearing exercises. Encounter for immunization Pneumonia vaccine given today      Jodi FORBES Keeling, MD [facility]

## 2024-06-13 NOTE — Assessment & Plan Note (Signed)
 Will send message to Dr Koval to discuss Prolia  injections Recheck calcium  today - Encouraged weight-bearing exercises.

## 2024-06-14 ENCOUNTER — Other Ambulatory Visit (HOSPITAL_COMMUNITY): Payer: Self-pay

## 2024-06-14 LAB — COMPREHENSIVE METABOLIC PANEL WITH GFR
ALT: 16 IU/L (ref 0–32)
AST: 17 IU/L (ref 0–40)
Albumin: 4.2 g/dL (ref 3.9–4.9)
Alkaline Phosphatase: 78 IU/L (ref 49–135)
BUN/Creatinine Ratio: 19 (ref 12–28)
BUN: 15 mg/dL (ref 8–27)
Bilirubin Total: 0.4 mg/dL (ref 0.0–1.2)
CO2: 22 mmol/L (ref 20–29)
Calcium: 9 mg/dL (ref 8.7–10.3)
Chloride: 102 mmol/L (ref 96–106)
Creatinine, Ser: 0.81 mg/dL (ref 0.57–1.00)
Globulin, Total: 2.4 g/dL (ref 1.5–4.5)
Glucose: 126 mg/dL — ABNORMAL HIGH (ref 70–99)
Potassium: 4.1 mmol/L (ref 3.5–5.2)
Sodium: 139 mmol/L (ref 134–144)
Total Protein: 6.6 g/dL (ref 6.0–8.5)
eGFR: 78 mL/min/1.73 (ref >59)

## 2024-06-14 LAB — CBC
Hematocrit: 42.4 % (ref 34.0–46.6)
Hemoglobin: 14.1 g/dL (ref 11.1–15.9)
MCH: 28.8 pg (ref 26.6–33.0)
MCHC: 33.3 g/dL (ref 31.5–35.7)
MCV: 87 fL (ref 79–97)
Platelets: 358 x10E3/uL (ref 150–450)
RBC: 4.9 x10E6/uL (ref 3.77–5.28)
RDW: 13.9 % (ref 11.7–15.4)
WBC: 7 x10E3/uL (ref 3.4–10.8)

## 2024-06-14 LAB — SYPHILIS: RPR W/REFLEX TO RPR TITER AND TREPONEMAL ANTIBODIES, TRADITIONAL SCREENING AND DIAGNOSIS ALGORITHM: RPR Ser Ql: NONREACTIVE

## 2024-06-14 LAB — TSH RFX ON ABNORMAL TO FREE T4: TSH: 2.26 u[IU]/mL (ref 0.450–4.500)

## 2024-06-14 LAB — VITAMIN D 25 HYDROXY (VIT D DEFICIENCY, FRACTURES): Vit D, 25-Hydroxy: 23 ng/mL — ABNORMAL LOW (ref 30.0–100.0)

## 2024-06-14 LAB — VITAMIN B12: Vitamin B-12: 714 pg/mL (ref 232–1245)

## 2024-06-15 ENCOUNTER — Other Ambulatory Visit: Payer: Self-pay | Admitting: Family Medicine

## 2024-06-15 DIAGNOSIS — M810 Age-related osteoporosis without current pathological fracture: Secondary | ICD-10-CM

## 2024-07-06 ENCOUNTER — Encounter: Payer: Self-pay | Admitting: Family Medicine

## 2024-07-08 ENCOUNTER — Ambulatory Visit: Admitting: Family Medicine

## 2024-07-08 ENCOUNTER — Encounter (INDEPENDENT_AMBULATORY_CARE_PROVIDER_SITE_OTHER): Payer: Self-pay

## 2024-07-08 VITALS — BP 152/80 | HR 71 | Temp 98.3°F | Ht 66.0 in

## 2024-07-08 DIAGNOSIS — R030 Elevated blood-pressure reading, without diagnosis of hypertension: Secondary | ICD-10-CM | POA: Diagnosis not present

## 2024-07-08 DIAGNOSIS — J019 Acute sinusitis, unspecified: Secondary | ICD-10-CM

## 2024-07-08 MED ORDER — BENZONATATE 100 MG PO CAPS: 100.0000 mg | ORAL_CAPSULE | Freq: Two times a day (BID) | ORAL | 0 refills | Status: AC | PRN

## 2024-07-08 MED ORDER — AMOXICILLIN 875 MG PO TABS: 875.0000 mg | ORAL_TABLET | Freq: Two times a day (BID) | ORAL | 0 refills | Status: AC

## 2024-07-08 NOTE — Patient Instructions (Addendum)
 It was so good to see you today! Thank you for allowing me to take care of you.  Today we discussed the following concerns and plans:  Sinus infection - I have sent antibiotics to your pharmacy, please take these as instructed. - I have also sent in a medicine for your cough. If your insurance does not cover this and it is expensive you can forego this and use an over the counter cough medicine of your choice instead. - continue to use cold/flu medicine if it is helpful. You may try saline nasal spray to loosen mucous.  Continue to keep an eye on your blood pressure. If you are noticing that it runs high frequently please call the clinic to schedule an appointment.  If you have any concerns, please call the clinic or schedule an appointment.  It was a pleasure to take care of you today. Be well!  Lauraine Norse, DO [facility], PGY-2  Do you need your medications delivered to your home?   Well send your prescription to the [facility] for delivery.          Address: [address] [place], [place], [place] [address]          Phone: [phone]  Please call the [facility] to speak with a pharmacist and set up your home medication delivery. If you have any questions, feel free to contact us  -- were happy to help!  Other [place] [facility]acies that offer affordable prices on both prescriptions and over-the-counter items, as well as convenient services like vaccinations, are  [facility], at [facility]         Address:  [address], [place], [place] [address]         Phone: [phone]  [facility], located in the [facility]        Address: [address], [place], [place] [address]        Phone: [phone]  [facility], at [facility]       Address: [address], [place], [place] [address]       Phone: [phone]  [facility], at [facility]       Address: [address], [place], [place] [address]       Phone: [phone]

## 2024-07-08 NOTE — Progress Notes (Addendum)
" ° ° °  SUBJECTIVE:   CHIEF COMPLAINT / HPI:   Sick symptoms Sinus pressure, headache, congestion. Started 12/9 - ultimately went to [facility] and tested negative for flu, covid. She tried Afrin nasal spray once without much relief. Today she tells me she still has hoarseness, cough and congestion which keeps her up at night. No fevers.  Elevated BP She is taking all of her medications faithfully. No HA, vision changes, shortness of breath. Does have some family stress currently.  PERTINENT  PMH / PSH: Reviewed.  OBJECTIVE:   BP (!) 152/80   Pulse 71   Temp 98.3 F (36.8 C) (Oral)   Ht 5' 6 (1.676 m)   SpO2 96%   BMI 29.50 kg/m   General: well-appearing, very pleasant, no acute distress. HEENT: normocephalic, PERRLA, EOM grossly intact, MMM, nares patent, oropharynx with mild erythema, no exudates. Cardio: Regular rate, regular rhythm, no murmurs on exam. Pulm: Clear, no wheezing, no crackles. No increased work of breathing. Abdominal: bowel sounds present, soft, non-tender, non-distended. Extremities: Moves all extremities equally. Neuro: Alert and oriented x3, speech normal in content, no facial asymmetry Psych:  Cognition and judgment appear intact.   ASSESSMENT/PLAN:   Assessment & Plan Acute non-recurrent sinusitis, unspecified location Symptoms and duration consistent with sinus infection. - amoxicillin  BID x 10 days - also sent in tessalon  perles for the cough, recommend taking before bed - discussed supportive care and return precautions. Elevated blood pressure reading Borderline controlled in setting of illness, she is compliant with HTN medications. Does have a good amount of stress right now related to family. No red flag signs/symptoms. - she will monitor her pressures at home and keep a log - continue lopressor  25 mg daily - recommend f/u in 2-4 weeks for reevaluation    Lauraine Norse, DO [facility] "

## 2024-12-28 ENCOUNTER — Ambulatory Visit: Admitting: Endocrinology
